# Patient Record
Sex: Male | Born: 2000 | Race: White | Hispanic: No | Marital: Single | Smoking: Former smoker
Health system: Southern US, Community
[De-identification: ages and names within clinical notes are randomized; demographics above are authoritative.]

## PROBLEM LIST (undated history)

## (undated) DIAGNOSIS — F419 Anxiety disorder, unspecified: Secondary | ICD-10-CM

## (undated) DIAGNOSIS — F909 Attention-deficit hyperactivity disorder, unspecified type: Secondary | ICD-10-CM

## (undated) DIAGNOSIS — IMO0002 Reserved for concepts with insufficient information to code with codable children: Secondary | ICD-10-CM

## (undated) DIAGNOSIS — M419 Scoliosis, unspecified: Secondary | ICD-10-CM

## (undated) HISTORY — DX: Anxiety disorder, unspecified: F41.9

## (undated) HISTORY — DX: Scoliosis, unspecified: M41.9

## (undated) HISTORY — DX: Reserved for concepts with insufficient information to code with codable children: IMO0002

## (undated) HISTORY — DX: Attention-deficit hyperactivity disorder, unspecified type: F90.9

---
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
## 2019-03-06 ENCOUNTER — Other Ambulatory Visit (INDEPENDENT_AMBULATORY_CARE_PROVIDER_SITE_OTHER): Payer: Self-pay | Admitting: Pediatric Endocrinology

## 2019-03-20 ENCOUNTER — Other Ambulatory Visit (INDEPENDENT_AMBULATORY_CARE_PROVIDER_SITE_OTHER): Payer: Self-pay | Admitting: Pediatric Endocrinology

## 2019-03-23 ENCOUNTER — Encounter (INDEPENDENT_AMBULATORY_CARE_PROVIDER_SITE_OTHER): Payer: Self-pay | Admitting: Pediatric Endocrinology

## 2019-03-23 ENCOUNTER — Other Ambulatory Visit: Payer: Self-pay

## 2019-03-23 ENCOUNTER — Ambulatory Visit (INDEPENDENT_AMBULATORY_CARE_PROVIDER_SITE_OTHER): Payer: Medicaid Other | Admitting: Pediatric Endocrinology

## 2019-03-23 DIAGNOSIS — F649 Gender identity disorder, unspecified: Secondary | ICD-10-CM | POA: Diagnosis not present

## 2019-03-23 DIAGNOSIS — F640 Transsexualism: Secondary | ICD-10-CM

## 2019-03-23 DIAGNOSIS — Z789 Other specified health status: Secondary | ICD-10-CM

## 2019-03-23 NOTE — Progress Notes (Signed)
This is a Pediatric Specialist E-Visit follow up consult provided via Telephone. Billy Oliver  consented to an E-Visit consult today.  Location of patient: Bo[place] Deans is at home (location) Location of provider: Koren Shiver is at office (location) Patient was referred by Richrd Sox, MD   The following participants were involved in this E-Visit: Dr. Vanessa Bushyhead and Billy Oliver (list of participants and their roles)  Chief Complain/ Reason for E-Visit today: Transgender Total time on call: 21 minutes Follow up: 3 months    Subjective:  Subjective  Patient Name: Billy Oliver Date of Birth: [birth year]  MRN: [id number]  Billy Oliver  presents Via Telephone today for initial evaluation and management of her gender dysphoria  HISTORY OF PRESENT ILLNESS:   Billy Oliver is a 18 y.o. transfemale   Billy Oliver was accompanied by herself   1. Billy Oliver was seen by her PCP in March 2018. At that visit they revisited previous concerns about gender identity. She had previously been referred to adolescent medicine in October 2017. Mom was unable to bring her to that appointment and the referral was dropped. She was seen in endocrine clinic in April 2018 and then was lost to follow up.   2. Billy Oliver was last seen in pediatric endocrine clinic on 12/21/18. In the interim she has been generally healthy.   At her last visit we started her on Bicalutamide. She hasn't noticed a lot of changes with this. She has noticed that she is having fewer erections.   She has stopped smoking.   She has not found a job yet. She is hoping to get her license gender marker changed this summer. She has a car now.   She has continued on Estrace, progesterone, Spironolactone and bicalutamide.  She is swallowing the prometrium.   She feels that her breasts are about the same size as they were at her last visit. They are no longer painful but are sensitive. She is on the fence about breast augmentation.   She would like  to have bottom surgery in the future if she can afford it.   She feels that her hair removal is somewhat less often with the spironolactone and bicalutamide.   She is not currently taking Vit D or Calcium.   She is still not seeing a therapist. She has thought about it- she is not sure that she could afford it.   3. Pertinent Review of Systems:  Constitutional: The patient feels "pretty good". The patient seems healthy and active. Eyes: Vision seems to be good. There are no recognized eye problems. Wears glasses.  Neck: The patient has no complaints of anterior neck swelling, soreness, tenderness, pressure, discomfort, or difficulty swallowing.   Heart: Heart rate increases with exercise or other physical activity. The patient has no complaints of palpitations, irregular heart beats, chest pain, or chest pressure.   Lungs: No asthma, or SOB Gastrointestinal: Bowel movents seem normal. The patient has no complaints of excessive hunger, acid reflux, upset stomach, stomach aches or pains, diarrhea, or constipation.  Legs: Muscle mass and strength seem normal. There are no complaints of numbness, tingling, burning, or pain. No edema is noted.  Feet: There are no obvious foot problems. There are no complaints of numbness, tingling, burning, or pain. No edema is noted. Neurologic: There are no recognized problems with muscle movement and strength, sensation, or coordination. GYN/GU:  Per HPI Skin: per HPI no acne.   PAST MEDICAL, FAMILY, AND SOCIAL HISTORY  Past Medical History:  Diagnosis Date  .  ADHD (attention deficit hyperactivity disorder)   . Anxiety   . Behavioral problems 06/21/2013  . Scoliosis     Family History  Problem Relation Age of Onset  . Diabetes Mother   . Hypertension Mother   . Neuropathy Mother   . Hyperlipidemia Mother   . Healthy Sister   . Heart disease Maternal Grandmother   . Cancer Maternal Grandfather   . Stroke Maternal Grandfather   . Diabetes Paternal  Grandmother   . Stroke Paternal Grandmother   . Aneurysm Paternal Grandfather   . Cancer Paternal Grandfather   . Drug abuse Father      Current Outpatient Medications:  .  bicalutamide (CASODEX) 50 MG tablet, TAKE 1 TABLET BY MOUTH EVERY DAY, Disp: 30 tablet, Rfl: 2 .  estradiol (ESTRACE) 2 MG tablet, TAKE 1 TABLET BY MOUTH EVERY DAY, Disp: 90 tablet, Rfl: 1 .  progesterone (PROMETRIUM) 100 MG capsule, TAKE 1 CAPSULE BY MOUTH EVERY DAY, Disp: 90 capsule, Rfl: 1 .  spironolactone (ALDACTONE) 50 MG tablet, Take 1 tablet (50 mg total) by mouth daily., Disp: 30 tablet, Rfl: 11  Allergies as of 03/23/2019  . (No Known Allergies)     reports that she has quit smoking. She has never used smokeless tobacco. She reports current drug use. Drug: Marijuana. She reports that she does not drink alcohol. Pediatric History  Patient Parents  . Ware,Susan (Mother)   Other Topics Concern  . Not on file  Social History Narrative   [employer] in 9th grade   GED 1. School and Family: Lives with mom  2. Activities: not active  3. Primary Care Provider: Richrd Sox, MD  ROS: There are no other significant problems involving Kincaid's other body systems.    Objective:  Objective  Vital Signs: Telephone visit  There were no vitals taken for this visit.  Blood pressure percentiles are not available for patients who are 18 years or older.  Ht Readings from Last 3 Encounters:  12/21/18 5' 2.6" (1.59 m) (<1 %, Z= -2.42)*  09/21/18 5' 2.68" (1.592 m) (<1 %, Z= -2.37)*  06/16/18 5' 3.31" (1.608 m) (2 %, Z= -2.12)*   * Growth percentiles are based on CDC (Boys, 2-20 Years) data.   Wt Readings from Last 3 Encounters:  12/21/18 140 lb (63.5 kg) (31 %, Z= -0.50)*  09/21/18 135 lb 6.4 oz (61.4 kg) (25 %, Z= -0.68)*  06/16/18 141 lb 3.2 oz (64 kg) (37 %, Z= -0.34)*   * Growth percentiles are based on CDC (Boys, 2-20 Years) data.   HC Readings from Last 3 Encounters:  No data found  for [facility]   There is no height or weight on file to calculate BSA. No height on file for this encounter. No weight on file for this encounter.   PHYSICAL EXAM:  Telephone visit   LAB DATA:   pending No results found for this or any previous visit (from the past 672 hour(s)).    Assessment and Plan:  Assessment  ASSESSMENT: Billy Oliver is a 18 y.o. caucasian transfemale followed for gender affirming hormone therapy.        Transgender - On Estrace, Spironolactone, Prometrium, and Bicalutamide - She continues to be pleased with feminization - She is looking into surgery- concerned about cost - She has not yet gotten her license gender marker changed- is planning to do this in the next month  - She is not currently seeing a therapist- discussed again importance of staying connected  with a provider.   PLAN:   1. Diagnostic:  CMP, lipids, vit d at next visit.  2. Therapeutic: Spironolactone 50 mg daily. Estrace 2 mg daily. Prometrium 100 mg daily. Bicalutamide 50 mg daily. Reviewed need for Vit D [phone] IU/day and Calcium [phone] mg/day 3. Patient education: Discussion of the above.  4. Follow-up: Return in about 3 months (around 06/23/2019).      Dessa PhiJennifer Shandie Bertz, MD  Telephone 21-30 min     Patient referred by Richrd SoxJohnson, Quan T, MD for gender dysphoria  Copy of this note sent to Richrd SoxJohnson, Quan T, MD

## 2019-03-23 NOTE — Patient Instructions (Addendum)
[facility] [phone]  Hazeline Junker [phone] [facility])  Lessons Learned Therapy Allyson Sabal [phone] [facility])   M. Mathis Dad Licensed Professional Counselor [phone] [facility] ** [phone]  Neysa Bonito, MS, [facility]  [address],  [place], [place] [address] [phone]  For bottom surgery: Dr Nancie Neas at [facility] [phone]  Vit D [phone] IU/day Calcium [phone] mg/day
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
## 2019-05-29 ENCOUNTER — Other Ambulatory Visit (INDEPENDENT_AMBULATORY_CARE_PROVIDER_SITE_OTHER): Payer: Self-pay | Admitting: Pediatric Endocrinology

## 2019-06-11 ENCOUNTER — Other Ambulatory Visit (INDEPENDENT_AMBULATORY_CARE_PROVIDER_SITE_OTHER): Payer: Self-pay | Admitting: Pediatric Endocrinology

## 2019-06-20 ENCOUNTER — Other Ambulatory Visit: Payer: Self-pay

## 2019-06-20 ENCOUNTER — Ambulatory Visit (INDEPENDENT_AMBULATORY_CARE_PROVIDER_SITE_OTHER): Payer: Medicaid Other | Admitting: Pediatric Endocrinology

## 2019-06-20 ENCOUNTER — Encounter (INDEPENDENT_AMBULATORY_CARE_PROVIDER_SITE_OTHER): Payer: Self-pay | Admitting: Pediatric Endocrinology

## 2019-06-20 VITALS — BP 98/60 | HR 82 | Wt 144.2 lb

## 2019-06-20 DIAGNOSIS — Z23 Encounter for immunization: Secondary | ICD-10-CM | POA: Diagnosis not present

## 2019-06-20 DIAGNOSIS — F640 Transsexualism: Secondary | ICD-10-CM | POA: Diagnosis not present

## 2019-06-20 DIAGNOSIS — Z789 Other specified health status: Secondary | ICD-10-CM

## 2019-06-20 NOTE — Progress Notes (Signed)
Subjective:  Subjective  Patient Name: Billy Oliver Date of Birth: [birth year]  MRN: [id number]  Billy Oliver  presents to clinic today for initial evaluation and management of her gender dysphoria  HISTORY OF PRESENT ILLNESS:   Billy Oliver is a 18 y.o. transfemale   Billy Oliver was accompanied by herself   1. Billy Oliver was seen by her PCP in March 2018. At that visit they revisited previous concerns about gender identity. She had previously been referred to adolescent medicine in October 2017. Mom was unable to bring her to that appointment and the referral was dropped. She was seen in endocrine clinic in April 2018 and then was lost to follow up.   2. Billy Oliver was last seen in pediatric endocrine clinic on 03/23/19. In the interim she has been generally healthy.   She is feeling frustrated about not being able to get a job. She doesn't have her GED and can't afford to get her name change/ID change. She worries that companies are discriminating against her in hiring although they are not overt about it. 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   She feels that her transition has slowed down since her last visit. She hasn't smoked since her last visit and she thought that her hormonal changes would be faster- but she feels stalled. She is coming up on her 1 year anniversary of starting her transition and feels that she should be further along. She wants more feminine features in her face and larger breasts. She feels that she should have more fullness in her face. She does not want plastic surgery.   She has continued on Bicalutamide. She hasn't noticed a lot of changes with this. She has noticed that she is having fewer erections.   She has continued on Estrace, progesterone, Spironolactone and bicalutamide.  She is swallowing the prometrium.   She feels that her breasts are about the same size as they were at her last visit. They are no longer painful or sensitive. She is on the fence about  breast augmentation.   She would like to have bottom surgery in the future if she can afford it.   She feels that her hair removal is somewhat less often with the spironolactone and bicalutamide. She is shaving about every other day.   She is not currently taking Vit D or Calcium.   She is still not seeing a therapist. She has thought about it- she is not sure that she could afford it.   3. Pertinent Review of Systems:  Constitutional: The patient feels "ok". The patient seems healthy and active. Eyes: Vision seems to be good. There are no recognized eye problems. Wears glasses.  Neck: The patient has no complaints of anterior neck swelling, soreness, tenderness, pressure, discomfort, or difficulty swallowing.   Heart: Heart rate increases with exercise or other physical activity. The patient has no complaints of palpitations, irregular heart beats, chest pain, or chest pressure.   Lungs: No asthma, or SOB Gastrointestinal: Bowel movents seem normal. The patient has no complaints of excessive hunger, acid reflux, upset stomach, stomach aches or pains, diarrhea, or constipation.  Legs: Muscle mass and strength seem normal. There are no complaints of numbness, tingling, burning, or pain. No edema is noted.  Feet: There are no obvious foot problems. There are no complaints of numbness, tingling, burning, or pain. No edema is noted. Neurologic: There are no recognized problems with muscle movement and strength, sensation, or coordination. GYN/GU:  Per HPI Skin: per  HPI no acne.   PAST MEDICAL, FAMILY, AND SOCIAL HISTORY  Past Medical History:  Diagnosis Date  . ADHD (attention deficit hyperactivity disorder)   . Anxiety   . Behavioral problems 06/21/2013  . Scoliosis     Family History  Problem Relation Age of Onset  . Diabetes Mother   . Hypertension Mother   . Neuropathy Mother   . Hyperlipidemia Mother   . Healthy Sister   . Heart disease Maternal Grandmother   . Cancer Maternal  Grandfather   . Stroke Maternal Grandfather   . Diabetes Paternal Grandmother   . Stroke Paternal Grandmother   . Aneurysm Paternal Grandfather   . Cancer Paternal Grandfather   . Drug abuse Father      Current Outpatient Medications:  .  bicalutamide (CASODEX) 50 MG tablet, TAKE 1 TABLET BY MOUTH EVERY DAY, Disp: 30 tablet, Rfl: 2 .  estradiol (ESTRACE) 2 MG tablet, TAKE 1 TABLET BY MOUTH EVERY DAY, Disp: 90 tablet, Rfl: 1 .  progesterone (PROMETRIUM) 100 MG capsule, TAKE 1 CAPSULE BY MOUTH EVERY DAY, Disp: 90 capsule, Rfl: 1 .  spironolactone (ALDACTONE) 50 MG tablet, TAKE 1 TABLET BY MOUTH EVERY DAY, Disp: 30 tablet, Rfl: 5  Allergies as of 06/20/2019  . (No Known Allergies)     reports that she has quit smoking. She has never used smokeless tobacco. She reports current drug use. Drug: Marijuana. She reports that she does not drink alcohol. Pediatric History  Patient Parents  . Ware,Susan (Mother)   Other Topics Concern  . Not on file  Social History Narrative   [employer] in 9th grade   GED 1. School and Family: Lives with mom  2. Activities: not active  3. Primary Care Provider: Richrd Sox, MD  ROS: There are no other significant problems involving Haydan's other body systems.    Objective:  Objective  Vital Signs:  BP 98/60   Pulse 82   Wt 144 lb 3.2 oz (65.4 kg)   BMI 25.87 kg/m   Blood pressure percentiles are not available for patients who are 18 years or older.  Ht Readings from Last 3 Encounters:  12/21/18 5' 2.6" (1.59 m) (26 %, Z= -0.65)*  09/21/18 5' 2.68" (1.592 m) (27 %, Z= -0.62)*  06/16/18 5' 3.31" (1.608 m) (36 %, Z= -0.36)*   * Growth percentiles are based on CDC (Girls, 2-20 Years) data.   Wt Readings from Last 3 Encounters:  06/20/19 144 lb 3.2 oz (65.4 kg) (76 %, Z= 0.71)*  12/21/18 140 lb (63.5 kg) (73 %, Z= 0.61)*  09/21/18 135 lb 6.4 oz (61.4 kg) (68 %, Z= 0.47)*   * Growth percentiles are based on CDC (Girls,  2-20 Years) data.   HC Readings from Last 3 Encounters:  No data found for [facility]   Body surface area is 1.7 meters squared. No height on file for this encounter. 76 %ile (Z= 0.71) based on CDC (Girls, 2-20 Years) weight-for-age data using vitals from 06/20/2019.   PHYSICAL EXAM:   Constitutional: The patient appears healthy and well nourished. The patient's height and weight are delayed for age.  She is short for MPH  She has gained 4 pounds.  Head: The head is normocephalic. Face: The face appears normal. There are no obvious dysmorphic features.  male pattern facial hair that is coarse.   Eyes: The eyes appear to be normally formed and spaced. Gaze is conjugate. There is no obvious arcus or proptosis. Moisture appears  normal. Ears: The ears are normally placed and appear externally normal. Mouth: The oropharynx and tongue appear normal. Dentition appears to be normal for age. Oral moisture is normal. Neck: The neck appears to be visibly normal.  The thyroid gland is 13 grams in size. The consistency of the thyroid gland is normal. The thyroid gland is not tender to palpation. Lungs: The lungs are clear to auscultation. Air movement is good. Heart: Heart rate and rhythm are regular. Heart sounds S1 and S2 are normal. I did not appreciate any pathologic cardiac murmurs. Abdomen: The abdomen appears to be normal in size for the patient's age. Bowel sounds are normal. There is no obvious hepatomegaly, splenomegaly, or other mass effect.  Arms: Muscle size and bulk are normal for age. Hands: There is no obvious tremor. Phalangeal and metacarpophalangeal joints are normal. Palmar muscles are normal for age. Palmar skin is normal. Palmar moisture is also normal. Legs: Muscles appear normal for age. No edema is present. Feet: Feet are normally formed. Dorsalis pedal pulses are normal. Neurologic: Strength is normal for age in both the upper and lower extremities. Muscle tone is normal. Sensation to  touch is normal in both the legs and feet.   GYN/GU: Puberty: Tanner stage pubic hair: V Tanner stage breast/genital V.   LAB DATA:   pending Results for orders placed or performed in visit on 06/20/19 (from the past 672 hour(s))  Follicle stimulating hormone   Collection Time: 06/20/19 12:00 AM  Result Value Ref Range   FSH 1.9 1.6 - 8.0 mIU/mL  Luteinizing hormone   Collection Time: 06/20/19 12:00 AM  Result Value Ref Range   LH 4.3 1.5 - 9.3 mIU/mL      Assessment and Plan:  Assessment  ASSESSMENT: Shanda BumpsJessica is a 18 y.o. caucasian transfemale followed for gender affirming hormone therapy.       Transgender - On Estrace, Spironolactone, Prometrium, and Bicalutamide - She is frustrated by rate of feminization - She is looking into surgery- concerned about cost - She has not yet gotten her license gender marker changed- is having issues with finances and costs associated with name change and gender marker change - She is not currently seeing a therapist- discussed again importance of staying connected with a provider.   PLAN:   1. Diagnostic:  CMP, lipids, vit d today 2. Therapeutic: Spironolactone 50 mg daily. Estrace 2 mg daily. Prometrium 100 mg daily. Bicalutamide 50 mg daily. Reviewed need for Vit D [phone] IU/day and Calcium [phone] mg/day. Will plan to increase Estrace after labs today 3. Patient education: Discussion of the above.  4. Follow-up: Return in about 3 months (around 09/19/2019).      Dessa PhiJennifer Valerie Fredin, MD  Level of Service: This visit lasted in excess of 25 minutes. More than 50% of the visit was devoted to counseling.    Patient referred by Richrd SoxJohnson, Quan T, MD for gender dysphoria  Copy of this note sent to Richrd SoxJohnson, Quan T, MD

## 2019-06-20 NOTE — Patient Instructions (Addendum)
Labs today  Will expect to increase your Estrogen dose  Consider taking Progestin per rectum.   Flu shot today! Remember to move that arm! It will take 2 weeks for full immune effect. This injection may not prevent flu but should reduce severity of disease.

## 2019-06-24 LAB — FOLLICLE STIMULATING HORMONE: FSH: 1.9 m[IU]/mL (ref 1.6–8.0)

## 2019-06-24 LAB — LUTEINIZING HORMONE: LH: 4.3 m[IU]/mL (ref 1.5–9.3)

## 2019-06-24 LAB — ESTRADIOL, ULTRA SENS: Estradiol, Ultra Sensitive: 79 pg/mL — ABNORMAL HIGH (ref <=29)

## 2019-06-27 ENCOUNTER — Other Ambulatory Visit (INDEPENDENT_AMBULATORY_CARE_PROVIDER_SITE_OTHER): Payer: Self-pay | Admitting: Pediatric Endocrinology

## 2019-06-27 MED ORDER — ESTRADIOL 2 MG PO TABS: 2.0000 mg | ORAL_TABLET | Freq: Two times a day (BID) | ORAL | 1 refills | Status: DC

## 2019-06-29 ENCOUNTER — Telehealth (INDEPENDENT_AMBULATORY_CARE_PROVIDER_SITE_OTHER): Payer: Self-pay | Admitting: *Deleted

## 2019-06-29 NOTE — Telephone Encounter (Signed)
LVM advised that per Dr. Baldo Ash:      Increase estradiol to 4 mg per day (2mg  twice a day). New Rx to pharmacy.    Please call with any questions.

## 2019-07-05 ENCOUNTER — Telehealth (INDEPENDENT_AMBULATORY_CARE_PROVIDER_SITE_OTHER): Payer: Self-pay | Admitting: Pediatric Endocrinology

## 2019-07-05 NOTE — Telephone Encounter (Signed)
  Who's calling (name and relationship to patient) : Billy Oliver, Patient  Best contact number: [phone]  Provider they see: Dr. Baldo Ash  Reason for call: Patient is calling to clarify with Dr. Baldo Ash about medication called estradiol. Went to pharmacy yesterday and they said they didn't have anything ready. but recalls that Dr. Baldo Ash said she would up the milligrams for the medication. Wondering if Dr. Baldo Ash said she would up the milligrams or if she said to just take 2 a day instead of 1 a day, wondering if she will/has sent over the prescription and if so which one is correct. Please advise.     PRESCRIPTION REFILL ONLY  Name of prescription:  Pharmacy:

## 2019-07-06 NOTE — Telephone Encounter (Signed)
Spoke with Billy Oliver. Let her know that she is supposed to be taking 2 mg a day. She states that the pharmacy told her that her medication hadnt been sent. I let her know that I would call the pharmacy and check on it for her.   Called pharmacy spoke with [place]. He states that the RX did come through that Billy Oliver just picked up her last RX and the new one wasn't ready to fill yet.   Called Billy Oliver back to let her know this. She verbally understands.

## 2019-08-31 ENCOUNTER — Other Ambulatory Visit (INDEPENDENT_AMBULATORY_CARE_PROVIDER_SITE_OTHER): Payer: Self-pay | Admitting: Pediatric Endocrinology

## 2019-09-08 ENCOUNTER — Other Ambulatory Visit (INDEPENDENT_AMBULATORY_CARE_PROVIDER_SITE_OTHER): Payer: Self-pay | Admitting: Pediatric Endocrinology

## 2019-09-25 ENCOUNTER — Other Ambulatory Visit: Payer: Self-pay

## 2019-09-25 ENCOUNTER — Ambulatory Visit (INDEPENDENT_AMBULATORY_CARE_PROVIDER_SITE_OTHER): Payer: Medicaid Other | Admitting: Pediatric Endocrinology

## 2019-09-25 ENCOUNTER — Encounter (INDEPENDENT_AMBULATORY_CARE_PROVIDER_SITE_OTHER): Payer: Self-pay | Admitting: Pediatric Endocrinology

## 2019-09-25 VITALS — BP 122/68 | HR 80 | Ht 62.8 in | Wt 150.8 lb

## 2019-09-25 DIAGNOSIS — F640 Transsexualism: Secondary | ICD-10-CM

## 2019-09-25 DIAGNOSIS — Z789 Other specified health status: Secondary | ICD-10-CM

## 2019-09-25 NOTE — Progress Notes (Signed)
Subjective:  Subjective  Patient Name: Billy Oliver Date of Birth: 02/28/2001  MRN: [id number]  Billy Oliver  presents to clinic today for initial evaluation and management of her gender dysphoria  HISTORY OF PRESENT ILLNESS:   Billy Oliver is a 18 y.o. transfemale   Billy Oliver was accompanied by herself   1. Billy Oliver was seen by her PCP in March 2018. At that visit they revisited previous concerns about gender identity. She had previously been referred to adolescent medicine in October 2017. Mom was unable to bring her to that appointment and the referral was dropped. She was seen in endocrine clinic in April 2018 and then was lost to follow up.   2. Billy Oliver was last seen in pediatric endocrine clinic on 06/20/19. In the interim she has been generally healthy.   She is now working at [employer] as a Nature conservation officer. She has enough hours to qualify for benefits. She is working in the grocery section.   She is working on working on getting her GED.   She is thinking about having breast augmentation.   She has noticed some increase in breast tissue with Bicalutamide. She is a 36 A now.   She has continued on Estrace 4 mg, progesterone and spironolactone.   She feels that her hair removal is somewhat less often with the spironolactone and bicalutamide. She is having facial hair waxed.   She is not currently taking Vit D or Calcium.   She is still not seeing a therapist. She has thought about it-  She is not sure that she needs someone.    3. Pertinent Review of Systems:  Constitutional: The patient feels "great". The patient seems healthy and active. Eyes: Vision seems to be good. There are no recognized eye problems. Wears glasses.  Neck: The patient has no complaints of anterior neck swelling, soreness, tenderness, pressure, discomfort, or difficulty swallowing.   Heart: Heart rate increases with exercise or other physical activity. The patient has no complaints of palpitations, irregular heart beats,  chest pain, or chest pressure.   Lungs: No asthma, or SOB Gastrointestinal: Bowel movents seem normal. The patient has no complaints of excessive hunger, acid reflux, upset stomach, stomach aches or pains, diarrhea, or constipation.  Legs: Muscle mass and strength seem normal. There are no complaints of numbness, tingling, burning, or pain. No edema is noted.  Feet: There are no obvious foot problems. There are no complaints of numbness, tingling, burning, or pain. No edema is noted. Neurologic: There are no recognized problems with muscle movement and strength, sensation, or coordination. GYN/GU:  Per HPI Skin: per HPI no acne.   PAST MEDICAL, FAMILY, AND SOCIAL HISTORY  Past Medical History:  Diagnosis Date  . ADHD (attention deficit hyperactivity disorder)   . Anxiety   . Behavioral problems 06/21/2013  . Scoliosis     Family History  Problem Relation Age of Onset  . Diabetes Mother   . Hypertension Mother   . Neuropathy Mother   . Hyperlipidemia Mother   . Healthy Sister   . Heart disease Maternal Grandmother   . Cancer Maternal Grandfather   . Stroke Maternal Grandfather   . Diabetes Paternal Grandmother   . Stroke Paternal Grandmother   . Aneurysm Paternal Grandfather   . Cancer Paternal Grandfather   . Drug abuse Father      Current Outpatient Medications:  .  bicalutamide (CASODEX) 50 MG tablet, TAKE 1 TABLET BY MOUTH EVERY DAY, Disp: 30 tablet, Rfl: 2 .  estradiol (ESTRACE)  2 MG tablet, Take 1 tablet (2 mg total) by mouth 2 (two) times daily., Disp: 180 tablet, Rfl: 1 .  progesterone (PROMETRIUM) 100 MG capsule, TAKE 1 CAPSULE BY MOUTH EVERY DAY, Disp: 90 capsule, Rfl: 1 .  spironolactone (ALDACTONE) 50 MG tablet, TAKE 1 TABLET BY MOUTH EVERY DAY, Disp: 30 tablet, Rfl: 5  Allergies as of 09/25/2019  . (No Known Allergies)     reports that she has quit smoking. She has never used smokeless tobacco. She reports current drug use. Drug: Marijuana. She reports that  she does not drink alcohol. Pediatric History  Patient Parents  . Ware,Susan (Mother)   Other Topics Concern  . Not on file  Social History Narrative   [employer] in 9th grade   GED 1. School and Family: Lives with mom  Working at [employer] 2. Activities: not active  3. Primary Care Provider: Richrd SoxJohnson, Quan T, MD  ROS: There are no other significant problems involving [place]'s other body systems.    Objective:  Objective  Vital Signs:   BP 122/68   Pulse 80   Ht 5' 2.8" (1.595 m)   Wt 150 lb 12.8 oz (68.4 kg)   BMI 26.89 kg/m   Blood pressure percentiles are not available for patients who are 18 years or older.  Ht Readings from Last 3 Encounters:  09/25/19 5' 2.8" (1.595 m) (28 %, Z= -0.59)*  12/21/18 5' 2.6" (1.59 m) (26 %, Z= -0.65)*  09/21/18 5' 2.68" (1.592 m) (27 %, Z= -0.62)*   * Growth percentiles are based on CDC (Girls, 2-20 Years) data.   Wt Readings from Last 3 Encounters:  09/25/19 150 lb 12.8 oz (68.4 kg) (82 %, Z= 0.90)*  06/20/19 144 lb 3.2 oz (65.4 kg) (76 %, Z= 0.71)*  12/21/18 140 lb (63.5 kg) (73 %, Z= 0.61)*   * Growth percentiles are based on CDC (Girls, 2-20 Years) data.   HC Readings from Last 3 Encounters:  No [facility] found for [facility]   Body surface area is 1.74 meters squared. 28 %ile (Z= -0.59) based on CDC (Girls, 2-20 Years) Stature-for-age data based on Stature recorded on 09/25/2019. 82 %ile (Z= 0.90) based on CDC (Girls, 2-20 Years) weight-for-age data using vitals from 09/25/2019.   PHYSICAL EXAM:   Constitutional: The patient appears healthy and well nourished. The patient's height and weight are delayed for age.  She is short for MPH  She has gained 6 pounds.  Head: The head is normocephalic. Face: The face appears normal. There are no obvious dysmorphic features.  male pattern facial hair that is coarse.   Eyes: The eyes appear to be normally formed and spaced. Gaze is conjugate. There is no obvious arcus or proptosis.  Moisture appears normal. Ears: The ears are normally placed and appear externally normal. Mouth: The oropharynx and tongue appear normal. Dentition appears to be normal for age. Oral moisture is normal. Neck: The neck appears to be visibly normal.  The thyroid gland is 13 grams in size. The consistency of the thyroid gland is normal. The thyroid gland is not tender to palpation. Lungs: The lungs are clear to auscultation. Air movement is good. Heart: Heart rate and rhythm are regular. Heart sounds S1 and S2 are normal. I did not appreciate any pathologic cardiac murmurs. Abdomen: The abdomen appears to be normal in size for the patient's age. Bowel sounds are normal. There is no obvious hepatomegaly, splenomegaly, or other mass effect.  Arms: Muscle size and bulk  are normal for age. Hands: There is no obvious tremor. Phalangeal and metacarpophalangeal joints are normal. Palmar muscles are normal for age. Palmar skin is normal. Palmar moisture is also normal. Legs: Muscles appear normal for age. No edema is present. Feet: Feet are normally formed. Dorsalis pedal pulses are normal. Neurologic: Strength is normal for age in both the upper and lower extremities. Muscle tone is normal. Sensation to touch is normal in both the legs and feet.   GYN/GU: Puberty: Tanner stage pubic hair: V Tanner stage breast/genital V.   LAB DATA:   pending No results found for this or any previous visit (from the past 672 hour(s)).    Assessment and Plan:  Assessment  ASSESSMENT: Janett Billow is a 18 y.o. caucasian transfemale followed for gender affirming hormone therapy.      Transgender - On Estrace, Spironolactone, Prometrium, and Bicalutamide - She is still frustrated by rate of feminization - She is looking into surgery- she is mostly focused on breast augmentation - She has not yet gotten her license gender marker changed- is having issues with finances and costs associated with name change and gender marker  change - She is not currently seeing a therapist- discussed again importance of staying connected with a provider.   PLAN:  1. Diagnostic:  Bmp, testosterone, estradiol today 2. Therapeutic: Spironolactone 50 mg daily. Estrace 4 mg daily. Prometrium 100 mg daily. Bicalutamide 50 mg daily. Reviewed need for Vit D [phone] IU/day and Calcium [phone] mg/day.  3. Patient education: Discussion of the above.  4. Follow-up: Return in about 6 months (around 03/25/2020).      Lelon Huh, MD  Level of Service: This visit lasted in excess of 25 minutes. More than 50% of the visit was devoted to counseling.     Patient referred by Kyra Leyland, MD for gender dysphoria  Copy of this note sent to Kyra Leyland, MD

## 2019-09-25 NOTE — Patient Instructions (Addendum)
Labs today  Vit D [phone] IU/day and Calcium [phone] mg/day   M. Esperanza Heir Licensed Professional Counselor [phone]  * Theodoro Parma [phone]  * Rosana Fret, [place], [address],  [place], [place] [address] [phone] - [phone]

## 2019-09-28 LAB — BASIC METABOLIC PANEL
BUN: 14 mg/dL (ref 7–20)
CO2: 29 mmol/L (ref 20–32)
Calcium: 9.8 mg/dL (ref 8.9–10.4)
Chloride: 101 mmol/L (ref 98–110)
Creat: 0.93 mg/dL (ref 0.60–1.26)
Glucose, Bld: 96 mg/dL (ref 65–139)
Potassium: 3.5 mmol/L — ABNORMAL LOW (ref 3.8–5.1)
Sodium: 139 mmol/L (ref 135–146)

## 2019-09-28 LAB — ESTRADIOL: Estradiol: 87 pg/mL — ABNORMAL HIGH (ref <=39)

## 2019-09-28 LAB — TESTOS,TOTAL,FREE AND SHBG (FEMALE)
Free Testosterone: 83.8 pg/mL (ref 35.0–155.0)
Sex Hormone Binding: 21 nmol/L (ref 10–50)
Testosterone, Total, LC-MS-MS: 379 ng/dL (ref 250–1100)

## 2019-10-08 ENCOUNTER — Encounter (INDEPENDENT_AMBULATORY_CARE_PROVIDER_SITE_OTHER): Payer: Self-pay

## 2019-10-09 ENCOUNTER — Encounter (INDEPENDENT_AMBULATORY_CARE_PROVIDER_SITE_OTHER): Payer: Self-pay

## 2019-10-24 ENCOUNTER — Encounter (INDEPENDENT_AMBULATORY_CARE_PROVIDER_SITE_OTHER): Payer: Self-pay

## 2019-11-02 ENCOUNTER — Ambulatory Visit: Payer: Medicaid Other | Admitting: General Surgery

## 2019-11-17 ENCOUNTER — Encounter (INDEPENDENT_AMBULATORY_CARE_PROVIDER_SITE_OTHER): Payer: Self-pay | Admitting: Pediatric Endocrinology

## 2019-11-27 ENCOUNTER — Other Ambulatory Visit (INDEPENDENT_AMBULATORY_CARE_PROVIDER_SITE_OTHER): Payer: Self-pay | Admitting: Pediatric Endocrinology

## 2019-11-28 ENCOUNTER — Ambulatory Visit: Payer: Medicaid Other | Admitting: General Surgery

## 2019-11-29 ENCOUNTER — Other Ambulatory Visit (INDEPENDENT_AMBULATORY_CARE_PROVIDER_SITE_OTHER): Payer: Self-pay | Admitting: Pediatric Endocrinology

## 2019-12-19 ENCOUNTER — Encounter (INDEPENDENT_AMBULATORY_CARE_PROVIDER_SITE_OTHER): Payer: Self-pay

## 2019-12-20 ENCOUNTER — Encounter (INDEPENDENT_AMBULATORY_CARE_PROVIDER_SITE_OTHER): Payer: Self-pay

## 2019-12-20 MED ORDER — ESTRADIOL 2 MG PO TABS: 2.0000 mg | ORAL_TABLET | Freq: Two times a day (BID) | ORAL | 1 refills | Status: DC

## 2019-12-24 ENCOUNTER — Encounter (INDEPENDENT_AMBULATORY_CARE_PROVIDER_SITE_OTHER): Payer: Self-pay

## 2019-12-25 ENCOUNTER — Encounter (INDEPENDENT_AMBULATORY_CARE_PROVIDER_SITE_OTHER): Payer: Self-pay

## 2019-12-26 ENCOUNTER — Encounter (INDEPENDENT_AMBULATORY_CARE_PROVIDER_SITE_OTHER): Payer: Self-pay

## 2019-12-26 NOTE — Telephone Encounter (Signed)
Yes- Gunnar Fusi reached out to me yesterday. She is going to see Dr. Leta Baptist  [phone]

## 2019-12-28 ENCOUNTER — Encounter (INDEPENDENT_AMBULATORY_CARE_PROVIDER_SITE_OTHER): Payer: Self-pay

## 2019-12-28 NOTE — Telephone Encounter (Signed)
It's for transgender top surgery (male).

## 2020-01-03 ENCOUNTER — Telehealth (INDEPENDENT_AMBULATORY_CARE_PROVIDER_SITE_OTHER): Payer: Self-pay | Admitting: Pediatric Endocrinology

## 2020-01-03 DIAGNOSIS — F640 Transsexualism: Secondary | ICD-10-CM

## 2020-01-03 DIAGNOSIS — Z789 Other specified health status: Secondary | ICD-10-CM

## 2020-01-03 NOTE — Telephone Encounter (Signed)
  Who's calling (name and relationship to patient) : [facility]  [facility] contact number: [phone] Provider they see: [facility] Reason for call: Billy Oliver's appt is tomorrow,  [facility] has not received a referral.  Please call ASAP.     PRESCRIPTION REFILL ONLY  Name of prescription:  Pharmacy:

## 2020-01-03 NOTE — Telephone Encounter (Signed)
Referral is sent.

## 2020-01-16 NOTE — Telephone Encounter (Signed)
Patient called to follow up on the last note. Billy Oliver is stating that [facility] has not received the referral for the augmentation yet and her appointment is tomorrow. Please follow up with patient & Cosmetic office

## 2020-01-16 NOTE — Telephone Encounter (Signed)
Spoke with [facility] office and confirmed they did not receive the referral. Fax number obtained, and referral faxed to them.

## 2020-01-23 ENCOUNTER — Encounter (INDEPENDENT_AMBULATORY_CARE_PROVIDER_SITE_OTHER): Payer: Self-pay | Admitting: Pediatric Endocrinology

## 2020-03-25 ENCOUNTER — Ambulatory Visit (INDEPENDENT_AMBULATORY_CARE_PROVIDER_SITE_OTHER): Payer: Medicaid Other | Admitting: Pediatric Endocrinology

## 2020-03-28 ENCOUNTER — Ambulatory Visit (INDEPENDENT_AMBULATORY_CARE_PROVIDER_SITE_OTHER): Payer: Medicaid Other | Admitting: Pediatric Endocrinology

## 2020-04-01 ENCOUNTER — Encounter (INDEPENDENT_AMBULATORY_CARE_PROVIDER_SITE_OTHER): Payer: Self-pay | Admitting: Pediatric Endocrinology

## 2020-04-01 ENCOUNTER — Ambulatory Visit (INDEPENDENT_AMBULATORY_CARE_PROVIDER_SITE_OTHER): Payer: Medicaid Other | Admitting: Pediatric Endocrinology

## 2020-04-01 ENCOUNTER — Other Ambulatory Visit: Payer: Self-pay

## 2020-04-01 VITALS — BP 118/76 | HR 88 | Wt 150.8 lb

## 2020-04-01 DIAGNOSIS — Z789 Other specified health status: Secondary | ICD-10-CM

## 2020-04-01 DIAGNOSIS — F640 Transsexualism: Secondary | ICD-10-CM

## 2020-04-01 NOTE — Progress Notes (Signed)
Subjective:  Subjective  Patient Name: Billy Oliver Date of Birth: 09/14/2001  MRN: [id number]  Billy Oliver  presents to clinic today for initial evaluation and management of her gender dysphoria  HISTORY OF PRESENT ILLNESS:   Billy Oliver is a 19 y.o. transfemale   Billy Oliver was accompanied by herself   1. Billy Oliver was seen by her PCP in March 2018. At that visit they revisited previous concerns about gender identity. She had previously been referred to adolescent medicine in October 2017. Mom was unable to bring her to that appointment and the referral was dropped. She was seen in endocrine clinic in April 2018 and then was lost to follow up.   2. Billy Oliver was last seen in pediatric endocrine clinic on 09/25/19. In the interim she has been generally healthy.   She is no longer working at [employer]. 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   She is currently unemployed.   She had a visit with Dr. Leta Baptist but didn't think that it went well. She is worried about long term expenses with breast implants.   She is back to sitting at home all the time.   She has stopped smoking. She is no longer hacking up phlem. She says that she stopped about a year ago.   She does not feel that her breasts have increased in size. She is still a 36 A. Her friend is the same size.   She has continued on Estrace 4 mg, progesterone and spironolactone, and bicalutamide.   She feels that her hair removal is somewhat less often with the spironolactone and bicalutamide. She is having facial hair waxed. She feels that it is only "stubble" on the tip of her chin.   She is not currently taking Vit D or Calcium.   She is still not seeing a therapist. She has thought about it-  She is not sure that she needs someone. She has had 1 visit with Junie Bame but has not returned. She is meant to follow up with her this month.    3. Pertinent Review of Systems:  Constitutional: The patient feels  "alright but hot". The patient seems healthy and active. Eyes: Vision seems to be good. There are no recognized eye problems. Wears glasses.  Neck: The patient has no complaints of anterior neck swelling, soreness, tenderness, pressure, discomfort, or difficulty swallowing.   Heart: Heart rate increases with exercise or other physical activity. The patient has no complaints of palpitations, irregular heart beats, chest pain, or chest pressure.   Lungs: No asthma, or SOB Gastrointestinal: Bowel movents seem normal. The patient has no complaints of excessive hunger, acid reflux, upset stomach, stomach aches or pains, diarrhea, or constipation.  Legs: Muscle mass and strength seem normal. There are no complaints of numbness, tingling, burning, or pain. No edema is noted.  Feet: There are no obvious foot problems. There are no complaints of numbness, tingling, burning, or pain. No edema is noted. Neurologic: There are no recognized problems with muscle movement and strength, sensation, or coordination. GYN/GU:  Per HPI Skin: per HPI no acne.   PAST MEDICAL, FAMILY, AND SOCIAL HISTORY  Past Medical History:  Diagnosis Date  . ADHD (attention deficit hyperactivity disorder)   . Anxiety   . Behavioral problems 06/21/2013  . Scoliosis     Family History  Problem Relation Age of Onset  . Diabetes Mother   . Hypertension Mother   . Neuropathy Mother   .  Hyperlipidemia Mother   . Healthy Sister   . Heart disease Maternal Grandmother   . Cancer Maternal Grandfather   . Stroke Maternal Grandfather   . Diabetes Paternal Grandmother   . Stroke Paternal Grandmother   . Aneurysm Paternal Grandfather   . Cancer Paternal Grandfather   . Drug abuse Father      Current Outpatient Medications:  .  bicalutamide (CASODEX) 50 MG tablet, TAKE 1 TABLET BY MOUTH EVERY DAY, Disp: 30 tablet, Rfl: 5 .  estradiol (ESTRACE) 2 MG tablet, Take 1 tablet (2 mg total) by mouth 2 (two) times daily., Disp: 180  tablet, Rfl: 1 .  progesterone (PROMETRIUM) 100 MG capsule, TAKE 1 CAPSULE BY MOUTH EVERY DAY, Disp: 90 capsule, Rfl: 1 .  spironolactone (ALDACTONE) 50 MG tablet, TAKE 1 TABLET BY MOUTH EVERY DAY, Disp: 30 tablet, Rfl: 5  Allergies as of 04/01/2020  . (No Known Allergies)     reports that she has quit smoking. She has never used smokeless tobacco. She reports current drug use. Drug: Marijuana. She reports that she does not drink alcohol. Pediatric History  Patient Parents  . Ware,Susan (Mother)   Other Topics Concern  . Not on file  Social History Narrative   [employer] in 9th grade   GED 1. School and Family: Lives with mom  Unemployed. She hasn't gotten involved with her GED.  2. Activities: not active  3. Primary Care Provider: Kyra Leyland, MD  ROS: There are no other significant problems involving [place]'s other body systems.    Objective:  Objective  Vital Signs:    BP 118/76   Pulse 88   Wt 150 lb 12.8 oz (68.4 kg)   BMI 26.89 kg/m   Blood pressure percentiles are not available for patients who are 18 years or older.  Ht Readings from Last 3 Encounters:  09/25/19 5' 2.8" (1.595 m) (28 %, Z= -0.59)*  12/21/18 5' 2.6" (1.59 m) (26 %, Z= -0.65)*  09/21/18 5' 2.68" (1.592 m) (27 %, Z= -0.62)*   * Growth percentiles are based on CDC (Girls, 2-20 Years) data.   Wt Readings from Last 3 Encounters:  04/01/20 150 lb 12.8 oz (68.4 kg) (81 %, Z= 0.86)*  09/25/19 150 lb 12.8 oz (68.4 kg) (82 %, Z= 0.90)*  06/20/19 144 lb 3.2 oz (65.4 kg) (76 %, Z= 0.71)*   * Growth percentiles are based on CDC (Girls, 2-20 Years) data.   HC Readings from Last 3 Encounters:  No data found for [facility]   Body surface area is 1.74 meters squared. No height on file for this encounter. 81 %ile (Z= 0.86) based on CDC (Girls, 2-20 Years) weight-for-age data using vitals from 04/01/2020.   PHYSICAL EXAM:   Constitutional: The patient appears healthy and well nourished. The  patient's height and weight are delayed for age.  She is short for MPH  Weight is stable  Head: The head is normocephalic. Face: The face appears normal. There are no obvious dysmorphic features.  male pattern facial hair that is coarse- mostly on chin Eyes: The eyes appear to be normally formed and spaced. Gaze is conjugate. There is no obvious arcus or proptosis. Moisture appears normal. Ears: The ears are normally placed and appear externally normal. Mouth: The oropharynx and tongue appear normal. Dentition appears to be normal for age. Oral moisture is normal. Neck: The neck appears to be visibly normal.  The thyroid gland is 13 grams in size. The consistency of the  thyroid gland is normal. The thyroid gland is not tender to palpation. Lungs: The lungs are clear to auscultation. Air movement is good. Heart: Heart rate and rhythm are regular. Heart sounds S1 and S2 are normal. I did not appreciate any pathologic cardiac murmurs. Abdomen: The abdomen appears to be normal in size for the patient's age. Bowel sounds are normal. There is no obvious hepatomegaly, splenomegaly, or other mass effect.  Arms: Muscle size and bulk are normal for age. Hands: There is no obvious tremor. Phalangeal and metacarpophalangeal joints are normal. Palmar muscles are normal for age. Palmar skin is normal. Palmar moisture is also normal. Legs: Muscles appear normal for age. No edema is present. Feet: Feet are normally formed. Dorsalis pedal pulses are normal. Neurologic: Strength is normal for age in both the upper and lower extremities. Muscle tone is normal. Sensation to touch is normal in both the legs and feet.   GYN/GU: Puberty: Tanner stage pubic hair: V Tanner stage breast/genital V.   LAB DATA:   pending No results found for this or any previous visit (from the past 672 hour(s)).    Assessment and Plan:  Assessment  ASSESSMENT: Billy Oliver is a 19 y.o. caucasian transfemale followed for gender affirming  hormone therapy.      Transgender - On Estrace, Spironolactone, Prometrium, and Bicalutamide - She did not have a positive experience with the breast surgeon and felt that the long term maintenance costs of surgical breast enhancement would be prohibitive.  - She has not yet gotten her license gender marker changed- is having issues with finances and costs associated with name change and gender marker change - She has established care with Junie Bame for therapy.   PLAN:  1. Diagnostic: CMP, Testosterone, LH, FSH, Lipids today 2. Therapeutic: Spironolactone 50 mg daily. Estrace 4 mg daily. Prometrium 100 mg daily. Bicalutamide 50 mg daily. Reviewed need for Vit D [phone] IU/day and Calcium [phone] mg/day.  3. Patient education: Discussion of the above. Also discussed GED.  4. Follow-up: Return in about 4 months (around 08/01/2020).      Dessa Phi, MD  Level of Service: >40 minutes spent today reviewing the medical chart, counseling the patient/family, and documenting today's encounter.    Patient referred by Richrd Sox, MD for gender dysphoria  Copy of this note sent to Richrd Sox, MD

## 2020-04-01 NOTE — Patient Instructions (Signed)
No change to medication.   Labs today.   Work on Music therapist.   Work on getting your gender marker changed on your license.   [email or web address]

## 2020-04-04 LAB — CBC WITH DIFFERENTIAL/PLATELET
Absolute Monocytes: 686 cells/uL (ref 200–950)
Basophils Absolute: 44 cells/uL (ref 0–200)
Basophils Relative: 0.5 %
Eosinophils Absolute: 167 cells/uL (ref 15–500)
Eosinophils Relative: 1.9 %
HCT: 45.4 % (ref 38.5–50.0)
Hemoglobin: 15.2 g/dL (ref 13.2–17.1)
Lymphs Abs: 2420 cells/uL (ref 850–3900)
MCH: 29.6 pg (ref 27.0–33.0)
MCHC: 33.5 g/dL (ref 32.0–36.0)
MCV: 88.5 fL (ref 80.0–100.0)
MPV: 10.1 fL (ref 7.5–12.5)
Monocytes Relative: 7.8 %
Neutro Abs: 5482 cells/uL (ref 1500–7800)
Neutrophils Relative %: 62.3 %
Platelets: 293 10*3/uL (ref 140–400)
RBC: 5.13 10*6/uL (ref 4.20–5.80)
RDW: 11.9 % (ref 11.0–15.0)
Total Lymphocyte: 27.5 %
WBC: 8.8 10*3/uL (ref 3.8–10.8)

## 2020-04-04 LAB — LUTEINIZING HORMONE: LH: 6.9 m[IU]/mL (ref 1.5–9.3)

## 2020-04-04 LAB — COMPREHENSIVE METABOLIC PANEL
AG Ratio: 1.7 (calc) (ref 1.0–2.5)
ALT: 28 U/L (ref 8–46)
AST: 22 U/L (ref 12–32)
Albumin: 4.9 g/dL (ref 3.6–5.1)
Alkaline phosphatase (APISO): 80 U/L (ref 46–169)
BUN: 15 mg/dL (ref 7–20)
CO2: 25 mmol/L (ref 20–32)
Calcium: 9.7 mg/dL (ref 8.9–10.4)
Chloride: 103 mmol/L (ref 98–110)
Creat: 0.98 mg/dL (ref 0.60–1.26)
Globulin: 2.9 g/dL (calc) (ref 2.1–3.5)
Glucose, Bld: 92 mg/dL (ref 65–99)
Potassium: 3.4 mmol/L — ABNORMAL LOW (ref 3.8–5.1)
Sodium: 139 mmol/L (ref 135–146)
Total Bilirubin: 0.7 mg/dL (ref 0.2–1.1)
Total Protein: 7.8 g/dL (ref 6.3–8.2)

## 2020-04-04 LAB — FOLLICLE STIMULATING HORMONE: FSH: 1.3 m[IU]/mL — ABNORMAL LOW (ref 1.6–8.0)

## 2020-04-04 LAB — LIPID PANEL
Cholesterol: 221 mg/dL — ABNORMAL HIGH (ref <170)
HDL: 43 mg/dL — ABNORMAL LOW (ref >45)
LDL Cholesterol (Calc): 163 mg/dL (calc) — ABNORMAL HIGH (ref <110)
Non-HDL Cholesterol (Calc): 178 mg/dL (calc) — ABNORMAL HIGH (ref <120)
Total CHOL/HDL Ratio: 5.1 (calc) — ABNORMAL HIGH (ref <5.0)
Triglycerides: 62 mg/dL (ref <90)

## 2020-04-04 LAB — TESTOS,TOTAL,FREE AND SHBG (FEMALE)
Free Testosterone: 48.7 pg/mL (ref 35.0–155.0)
Sex Hormone Binding: 28 nmol/L (ref 10–50)
Testosterone, Total, LC-MS-MS: 272 ng/dL (ref 250–1100)

## 2020-05-30 ENCOUNTER — Other Ambulatory Visit (INDEPENDENT_AMBULATORY_CARE_PROVIDER_SITE_OTHER): Payer: Self-pay | Admitting: Pediatric Endocrinology

## 2020-08-01 ENCOUNTER — Ambulatory Visit (INDEPENDENT_AMBULATORY_CARE_PROVIDER_SITE_OTHER): Payer: Medicaid Other | Admitting: Pediatric Endocrinology

## 2020-08-02 ENCOUNTER — Other Ambulatory Visit (INDEPENDENT_AMBULATORY_CARE_PROVIDER_SITE_OTHER): Payer: Self-pay | Admitting: Pediatric Endocrinology

## 2020-08-06 ENCOUNTER — Ambulatory Visit (INDEPENDENT_AMBULATORY_CARE_PROVIDER_SITE_OTHER): Payer: Medicaid Other | Admitting: Pediatric Endocrinology

## 2020-09-26 ENCOUNTER — Encounter (INDEPENDENT_AMBULATORY_CARE_PROVIDER_SITE_OTHER): Payer: Self-pay | Admitting: Pediatric Endocrinology

## 2020-09-26 ENCOUNTER — Ambulatory Visit (INDEPENDENT_AMBULATORY_CARE_PROVIDER_SITE_OTHER): Payer: Medicaid Other | Admitting: Pediatric Endocrinology

## 2020-09-26 ENCOUNTER — Other Ambulatory Visit: Payer: Self-pay

## 2020-09-26 ENCOUNTER — Other Ambulatory Visit (INDEPENDENT_AMBULATORY_CARE_PROVIDER_SITE_OTHER): Payer: Self-pay | Admitting: Pediatric Endocrinology

## 2020-09-26 VITALS — BP 124/67 | HR 73 | Wt 158.7 lb

## 2020-09-26 DIAGNOSIS — Z23 Encounter for immunization: Secondary | ICD-10-CM | POA: Diagnosis not present

## 2020-09-26 DIAGNOSIS — Z789 Other specified health status: Secondary | ICD-10-CM | POA: Diagnosis not present

## 2020-09-26 MED ORDER — MEDROXYPROGESTERONE ACETATE 150 MG/ML IM SUSP
150.0000 mg | INTRAMUSCULAR | 3 refills | Status: DC
Start: 2020-09-26 — End: 2020-09-26

## 2020-09-26 MED ORDER — ESTRADIOL 2 MG PO TABS: 2.0000 mg | ORAL_TABLET | Freq: Three times a day (TID) | ORAL | 1 refills | Status: DC

## 2020-09-26 MED ORDER — MEDROXYPROGESTERONE ACETATE 150 MG/ML IM SUSP
150.0000 mg | Freq: Once | INTRAMUSCULAR | Status: AC
  Administered 2020-09-26: 150 mg via INTRAMUSCULAR

## 2020-09-26 NOTE — Patient Instructions (Signed)
Billy Oliver [phone]  Increase Estrace to 3 times a day.   No nicotine!  Calcium 1 mg daily Vit D 2000 IU daily

## 2020-09-26 NOTE — Progress Notes (Signed)
Subjective:  Subjective  Patient Name: Billy Oliver Date of Birth: 2001/09/12  MRN: [id number]  Billy Oliver  presents to clinic today for initial evaluation and management of her gender dysphoria  HISTORY OF PRESENT ILLNESS:   Billy Oliver is a 19 y.o. transfemale   Billy Oliver was accompanied by herself   1. Billy Oliver was seen by her PCP in March 2018. At that visit they revisited previous concerns about gender identity. She had previously been referred to adolescent medicine in October 2017. Mom was unable to bring her to that appointment and the referral was dropped. She was seen in endocrine clinic in April 2018 and then was lost to follow up.   2. Billy Oliver was last seen in pediatric endocrine clinic on 04/01/20. In the interim she has been generally healthy.   She is working at [employer] again. She is stressing that she has gained too much weight- and mostly around her middle.   She is not smoking. She has not been smoking since 2020.   She feels that her breasts are a little larger - she is now a 36 B.   She has continued on Estrace 4 mg, progesterone and spironolactone, and bicalutamide.   She had been waxing her face- she stopped because she was breaking out too much. She is now back to shaving daily.   She has been taking Vit C.  She is not currently taking Vit D or Calcium.   She did not have follow up with Junie Bame.  She says that she got a new phone and lost the number.    3. Pertinent Review of Systems:  Constitutional: The patient feels "good". The patient seems healthy and active. Eyes: Vision seems to be good. There are no recognized eye problems. Wears glasses.  Neck: The patient has no complaints of anterior neck swelling, soreness, tenderness, pressure, discomfort, or difficulty swallowing.   Heart: Heart rate increases with exercise or other physical activity. The patient has no complaints of palpitations, irregular heart beats, chest pain, or chest pressure.   Lungs: No  asthma, or SOB Gastrointestinal: Bowel movents seem normal. The patient has no complaints of excessive hunger, acid reflux, upset stomach, stomach aches or pains, diarrhea, or constipation.  Legs: Muscle mass and strength seem normal. There are no complaints of numbness, tingling, burning, or pain. No edema is noted.  Feet: There are no obvious foot problems. There are no complaints of numbness, tingling, burning, or pain. No edema is noted. Neurologic: There are no recognized problems with muscle movement and strength, sensation, or coordination. GYN/GU:  Per HPI Skin: per HPI no acne.   PAST MEDICAL, FAMILY, AND SOCIAL HISTORY  Past Medical History:  Diagnosis Date  . ADHD (attention deficit hyperactivity disorder)   . Anxiety   . Behavioral problems 06/21/2013  . Scoliosis     Family History  Problem Relation Age of Onset  . Diabetes Mother   . Hypertension Mother   . Neuropathy Mother   . Hyperlipidemia Mother   . Healthy Sister   . Heart disease Maternal Grandmother   . Cancer Maternal Grandfather   . Stroke Maternal Grandfather   . Diabetes Paternal Grandmother   . Stroke Paternal Grandmother   . Aneurysm Paternal Grandfather   . Cancer Paternal Grandfather   . Drug abuse Father      Current Outpatient Medications:  .  bicalutamide (CASODEX) 50 MG tablet, TAKE 1 TABLET BY MOUTH EVERY DAY, Disp: 30 tablet, Rfl: 5 .  progesterone (  PROMETRIUM) 100 MG capsule, TAKE 1 CAPSULE BY MOUTH EVERY DAY, Disp: 90 capsule, Rfl: 1 .  spironolactone (ALDACTONE) 50 MG tablet, TAKE 1 TABLET BY MOUTH EVERY DAY, Disp: 30 tablet, Rfl: 5 .  estradiol (ESTRACE) 2 MG tablet, Take 1 tablet (2 mg total) by mouth 3 (three) times daily., Disp: 270 tablet, Rfl: 1 .  medroxyPROGESTERone (DEPO-PROVERA) 150 MG/ML injection, Inject 1 mL (150 mg total) into the muscle every 3 (three) months., Disp: 1 mL, Rfl: 3  Allergies as of 09/26/2020  . (No Known Allergies)     reports that she has quit smoking.  She has never used smokeless tobacco. She reports current drug use. Drug: Marijuana. She reports that she does not drink alcohol. Pediatric History  Patient Parents  . Ware,Susan (Mother)   Other Topics Concern  . Not on file  Social History Narrative   [employer] in 9th grade    1. School and Family: Lives with mom  Working at [employer].  She hasn't gotten her GED. She applied for night classes- went for one course and then thought they were mean and dropped out.  2. Activities: not active  3. Primary Care Provider: Richrd Sox, MD  ROS: There are no other significant problems involving Loris's other body systems.    Objective:  Objective  Vital Signs:    BP 124/67   Pulse 73   Wt 158 lb 11.7 oz (72 kg)   BMI 28.30 kg/m   Growth percentile SmartLinks can only be used for patients less than 22 years old.  Ht Readings from Last 3 Encounters:  09/25/19 5' 2.8" (1.595 m) (28 %, Z= -0.59)*  12/21/18 5' 2.6" (1.59 m) (26 %, Z= -0.65)*  09/21/18 5' 2.68" (1.592 m) (27 %, Z= -0.62)*   * Growth percentiles are based on CDC (Girls, 2-20 Years) data.   Wt Readings from Last 3 Encounters:  09/26/20 158 lb 11.7 oz (72 kg)  04/01/20 150 lb 12.8 oz (68.4 kg) (81 %, Z= 0.86)*  09/25/19 150 lb 12.8 oz (68.4 kg) (82 %, Z= 0.90)*   * Growth percentiles are based on CDC (Girls, 2-20 Years) data.   HC Readings from Last 3 Encounters:  No data found for [facility]   Body surface area is 1.79 meters squared. Facility age limit for growth percentiles is 20 years. Facility age limit for growth percentiles is 20 years.   PHYSICAL EXAM:   Constitutional: The patient appears healthy and well nourished. The patient's height and weight are delayed for age.  She is short for MPH  Weight is + 8 pounds.  Head: The head is normocephalic. Face: The face appears normal. There are no obvious dysmorphic features.  male pattern facial hair that is coarse- mostly on chin Eyes: The eyes appear  to be normally formed and spaced. Gaze is conjugate. There is no obvious arcus or proptosis. Moisture appears normal. Ears: The ears are normally placed and appear externally normal. Mouth: The oropharynx and tongue appear normal. Dentition appears to be normal for age. Oral moisture is normal. Neck: The neck appears to be visibly normal.  The thyroid gland is 13 grams in size. The consistency of the thyroid gland is normal. The thyroid gland is not tender to palpation. Lungs: The lungs are clear to auscultation. Air movement is good. Heart: Heart rate and rhythm are regular. Heart sounds S1 and S2 are normal. I did not appreciate any pathologic cardiac murmurs. Abdomen: The abdomen appears to  be normal in size for the patient's age. Bowel sounds are normal. There is no obvious hepatomegaly, splenomegaly, or other mass effect.  Arms: Muscle size and bulk are normal for age. Hands: There is no obvious tremor. Phalangeal and metacarpophalangeal joints are normal. Palmar muscles are normal for age. Palmar skin is normal. Palmar moisture is also normal. Legs: Muscles appear normal for age. No edema is present. Feet: Feet are normally formed. Dorsalis pedal pulses are normal. Neurologic: Strength is normal for age in both the upper and lower extremities. Muscle tone is normal. Sensation to touch is normal in both the legs and feet.   GYN/GU: Puberty: Tanner stage pubic hair: V Tanner stage breast/genital V.   LAB DATA:   pending No results found for this or any previous visit (from the past 672 hour(s)).    Assessment and Plan:  Assessment  ASSESSMENT: Billy Oliver is a 19 y.o. caucasian transfemale followed for gender affirming hormone therapy.      Transgender - On Estrace, Spironolactone, Prometrium, and Bicalutamide - She has not yet gotten her license gender marker changed- is having issues with finances and costs associated with name change and gender marker change - She has established care  with Junie Bame for therapy but has not continued care  PLAN:   1. Diagnostic: Testosterone, LH, FSH,  Estrogen levels ordered 2. Therapeutic: Increase Estrace to 6 mg daily. Start Depo Provera q3 months. Continue Spironolactone 50 mg daily. Prometrium 100 mg daily. Bicalutamide 50 mg daily. Reviewed need for Vit D [phone] IU/day and Calcium [phone] mg/day. Flu vaccine today.  3. Patient education: Discussion of the above. Also discussed GED.  4. Follow-up: Return in about 6 months (around 03/27/2021).      Dessa Phi, MD  Level of Service: >40 minutes spent today reviewing the medical chart, counseling the patient/family, and documenting today's encounter.     Patient referred by Richrd Sox, MD for gender dysphoria  Copy of this note sent to Richrd Sox, MD

## 2020-09-30 ENCOUNTER — Telehealth (INDEPENDENT_AMBULATORY_CARE_PROVIDER_SITE_OTHER): Payer: Self-pay | Admitting: Pediatric Endocrinology

## 2020-09-30 NOTE — Telephone Encounter (Signed)
  Who's calling (name and relationship to patient) : Billy Oliver (patient)  Best contact number: [phone]  Provider they see: Dr. Vanessa Linton Hall  Reason for call: Patient was unable to get labs done the other day. Requests that orders be sent to [facility] in [place].    PRESCRIPTION REFILL ONLY  Name of prescription:  Pharmacy:

## 2020-10-01 NOTE — Addendum Note (Signed)
Addended by: Vallery Sa on: 10/01/2020 10:25 AM   Modules accepted: Orders

## 2020-10-01 NOTE — Telephone Encounter (Signed)
Labs have been sent. Atte

## 2020-10-22 ENCOUNTER — Encounter (HOSPITAL_COMMUNITY): Payer: Self-pay | Admitting: *Deleted

## 2020-10-22 ENCOUNTER — Emergency Department (HOSPITAL_COMMUNITY): Payer: Medicaid Other

## 2020-10-22 ENCOUNTER — Other Ambulatory Visit: Payer: Self-pay

## 2020-10-22 ENCOUNTER — Emergency Department (HOSPITAL_COMMUNITY)
Admission: EM | Admit: 2020-10-22 | Discharge: 2020-10-23 | Disposition: A | Discharge status: Home / Self Care | Payer: Medicaid Other | Attending: Emergency Medicine | Admitting: Emergency Medicine

## 2020-10-22 DIAGNOSIS — R0789 Other chest pain: Secondary | ICD-10-CM | POA: Diagnosis present

## 2020-10-22 DIAGNOSIS — Z87891 Personal history of nicotine dependence: Secondary | ICD-10-CM | POA: Insufficient documentation

## 2020-10-22 DIAGNOSIS — Z79899 Other long term (current) drug therapy: Secondary | ICD-10-CM | POA: Insufficient documentation

## 2020-10-22 DIAGNOSIS — R079 Chest pain, unspecified: Secondary | ICD-10-CM

## 2020-10-22 DIAGNOSIS — R0602 Shortness of breath: Secondary | ICD-10-CM | POA: Insufficient documentation

## 2020-10-22 LAB — BASIC METABOLIC PANEL
Anion gap: 9 (ref 5–15)
BUN: 12 mg/dL (ref 6–20)
CO2: 22 mmol/L (ref 22–32)
Calcium: 9.4 mg/dL (ref 8.9–10.3)
Chloride: 105 mmol/L (ref 98–111)
Creatinine, Ser: 0.81 mg/dL (ref 0.61–1.24)
GFR, Estimated: >60 mL/min (ref >60)
Glucose, Bld: 85 mg/dL (ref 70–99)
Potassium: 3.6 mmol/L (ref 3.5–5.1)
Sodium: 136 mmol/L (ref 135–145)

## 2020-10-22 LAB — CBC
HCT: 47.4 % (ref 39.0–52.0)
Hemoglobin: 15.5 g/dL (ref 13.0–17.0)
MCH: 29 pg (ref 26.0–34.0)
MCHC: 32.7 g/dL (ref 30.0–36.0)
MCV: 88.8 fL (ref 80.0–100.0)
Platelets: 335 10*3/uL (ref 150–400)
RBC: 5.34 MIL/uL (ref 4.22–5.81)
RDW: 12 % (ref 11.5–15.5)
WBC: 13.3 10*3/uL — ABNORMAL HIGH (ref 4.0–10.5)
nRBC: 0 % (ref 0.0–0.2)

## 2020-10-22 LAB — TROPONIN I (HIGH SENSITIVITY)
Troponin I (High Sensitivity): <2 ng/L (ref <18)
Troponin I (High Sensitivity): <2 ng/L (ref <18)

## 2020-10-22 MED ORDER — OMEPRAZOLE 20 MG PO CPDR: 20.0000 mg | DELAYED_RELEASE_CAPSULE | Freq: Every day | ORAL | 1 refills | Status: AC

## 2020-10-22 MED ORDER — SUCRALFATE 1 GM/10ML PO SUSP
1.0000 g | Freq: Once | ORAL | Status: AC
  Administered 2020-10-22: 1 g via ORAL
  Filled 2020-10-22: qty 10

## 2020-10-22 NOTE — ED Triage Notes (Signed)
Chest pain onset yesterday 

## 2020-10-22 NOTE — Discharge Instructions (Addendum)
You were seen in the emergency department for evaluation of chest pain radiating into your left shoulder. You had blood work EKG and a chest x-ray that did not show any obvious explanation for your symptoms.  Your vital signs were good.  We are putting on some acid medication to see if that may help your symptoms.  We do recommend that you do not smoke.  Follow-up with your primary care doctor and return to the emergency department if any worsening symptoms

## 2020-10-22 NOTE — ED Provider Notes (Signed)
[facility] EMERGENCY DEPARTMENT Provider Note   CSN: [id number] Arrival date & time: 10/22/20  1301     History Chief Complaint  Patient presents with  . Chest Pain    Billy Oliver is a 20 y.o. adult.  She is coming in for evaluation of chest pain on and off that started yesterday.  Has had chest pain before but never lasted longer than 30 minutes.  Was worsening while she was here.  Associated with some shortness of breath and it radiates down the left arm.  Smoker.  On hormone therapy.   Chest Pain Associated symptoms: no abdominal pain, no fever, no headache and no shortness of breath     HPI: A 20 year old patient presents for evaluation of chest pain. Initial onset of pain was more than 6 hours ago. The patient's chest pain is described as heaviness/pressure/tightness and is not worse with exertion. The patient's chest pain is middle- or left-sided, is not well-localized, is not sharp and does radiate to the arms/jaw/neck. The patient does not complain of nausea and denies diaphoresis. The patient has smoked in the past 90 days. The patient has no history of stroke, has no history of peripheral artery disease, denies any history of treated diabetes, has no relevant family history of coronary artery disease (first degree relative at less than age 6), is not hypertensive, has no history of hypercholesterolemia and does not have an elevated BMI (>=30).   Past Medical History:  Diagnosis Date  . ADHD (attention deficit hyperactivity disorder)   . Anxiety   . Behavioral problems 06/21/2013  . Scoliosis     Patient Active Problem List   Diagnosis Date Noted  . Transgender 09/21/2018  . 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 Hirsutism 02/08/2017  . Emotional stress 09/18/2015  . Behavioral problems 06/21/2013    History reviewed. No pertinent surgical history.     Family History  Problem Relation Age of Onset  . Diabetes Mother   . Hypertension Mother   . Neuropathy Mother   .  Hyperlipidemia Mother   . Healthy Sister   . Heart disease Maternal Grandmother   . Cancer Maternal Grandfather   . Stroke Maternal Grandfather   . Diabetes Paternal Grandmother   . Stroke Paternal Grandmother   . Aneurysm Paternal Grandfather   . Cancer Paternal Grandfather   . Drug abuse Father     Social History   Tobacco Use  . Smoking status: Former Research scientist (life sciences)  . Smokeless tobacco: Never Used  Vaping Use  . Vaping Use: Every day  Substance Use Topics  . Alcohol use: No  . Drug use: Yes    Types: Marijuana    Home Medications Prior to Admission medications   Medication Sig Start Date End Date Taking? Authorizing Provider  bicalutamide (CASODEX) 50 MG tablet TAKE 1 TABLET BY MOUTH EVERY DAY 05/30/20   Lelon Huh, MD  estradiol (ESTRACE) 2 MG tablet Take 1 tablet (2 mg total) by mouth 3 (three) times daily. 09/26/20   Lelon Huh, MD  medroxyPROGESTERone (DEPO-PROVERA) 150 MG/ML injection Inject 1 mL (150 mg total) into the muscle every 3 (three) months. 09/26/20   Lelon Huh, MD  progesterone (PROMETRIUM) 100 MG capsule TAKE 1 CAPSULE BY MOUTH EVERY DAY 08/02/20   Lelon Huh, MD  spironolactone (ALDACTONE) 50 MG tablet TAKE 1 TABLET BY MOUTH EVERY DAY 11/27/19   Lelon Huh, MD    Allergies    Patient has no known allergies.  Review of Systems   Review  of Systems  Constitutional: Negative for fever.  HENT: Negative for sore throat.   Eyes: Negative for visual disturbance.  Respiratory: Negative for shortness of breath.   Cardiovascular: Positive for chest pain.  Gastrointestinal: Negative for abdominal pain.  Genitourinary: Negative for dysuria.  Musculoskeletal: Negative for neck pain.  Skin: Negative for rash.  Neurological: Negative for headaches.    Physical Exam Updated Vital Signs BP 116/79 (BP Location: Right Arm)   Pulse 67   Temp 98.1 F (36.7 C) (Oral)   Resp 18   SpO2 100%   Physical Exam Vitals and nursing note reviewed.   Constitutional:      Appearance: She is well-developed and well-nourished.  HENT:     Head: Normocephalic and atraumatic.  Eyes:     Conjunctiva/sclera: Conjunctivae normal.  Cardiovascular:     Rate and Rhythm: Normal rate and regular rhythm.     Heart sounds: Normal heart sounds.  Pulmonary:     Effort: Pulmonary effort is normal.  Abdominal:     Palpations: Abdomen is soft. There is no mass.     Tenderness: There is no abdominal tenderness.  Musculoskeletal:        General: Normal range of motion.     Cervical back: Neck supple.     Right lower leg: No tenderness. No edema.     Left lower leg: No tenderness. No edema.  Skin:    General: Skin is warm and dry.     Capillary Refill: Capillary refill takes less than 2 seconds.  Neurological:     General: No focal deficit present.     Mental Status: She is alert.     GCS: GCS eye subscore is 4. GCS verbal subscore is 5. GCS motor subscore is 6.  Psychiatric:        Mood and Affect: Mood and affect normal.     ED Results / Procedures / Treatments   Labs (all labs ordered are listed, but only abnormal results are displayed) Labs Reviewed  CBC - Abnormal; Notable for the following components:      Result Value   WBC 13.3 (*)    All other components within normal limits  BASIC METABOLIC PANEL  TROPONIN I (HIGH SENSITIVITY)  TROPONIN I (HIGH SENSITIVITY)    EKG EKG Interpretation  Date/Time:  Tuesday October 22 2020 14:21:59 EST Ventricular Rate:  74 PR Interval:  118 QRS Duration: 74 QT Interval:  368 QTC Calculation: 408 R Axis:   98 Text Interpretation: Normal sinus rhythm with sinus arrhythmia Rightward axis Nonspecific T wave abnormality Abnormal ECG No old tracing to compare Confirmed by Meridee Score [phone]) on 10/22/2020 3:08:59 PM   Radiology DG Chest Port 1 View  Result Date: 10/22/2020 CLINICAL DATA:  Chest pain EXAM: PORTABLE CHEST 1 VIEW COMPARISON:  03/05/2018 FINDINGS: The heart size and  mediastinal contours are within normal limits. Both lungs are clear. The visualized skeletal structures are unremarkable. IMPRESSION: No active disease. Electronically Signed   By: Helyn Numbers MD   On: 10/22/2020 23:31    Procedures Procedures (including critical care time)  Medications Ordered in ED Medications  sucralfate (CARAFATE) 1 GM/10ML suspension 1 g (has no administration in time range)    ED Course  I have reviewed the triage vital signs and the nursing notes.  Pertinent labs & imaging results that were available during my care of the patient were reviewed by me and considered in my medical decision making (see chart for details).  Clinical  Course as of 10/23/20 1033  Tue Oct 22, 2020  2251 Patient is not PERC negative due to being on hormone therapy but otherwise has no evidence of DVT or PE.  Not tachycardic not tachypneic not hypoxic and no leg swelling or tenderness.  Pain is not pleuritic in nature. [MB]  2252 Delta troponin negative.  Does have a mildly elevated white count. [MB]  2312 Carafate slurry with some slight improvement in her discomfort.  Currently rates it as a 2 out of 10. [MB]  2330 Chest x-ray interpreted by me as no acute infiltrates. [MB]    Clinical Course User Index [MB] Terrilee Files, MD   MDM Rules/Calculators/A&P HEAR Score: 3                       This patient complains of pain radiating to left shoulder and arm; this involves an extensive number of treatment Options and is a complaint that carries with it a high risk of complications and Morbidity. The differential includes ACS, pneumonia, PE, pneumothorax, musculoskeletal, GERD  I ordered, reviewed and interpreted labs, which included CBC with mildly elevated white count, normal hemoglobin, chemistries normal including normal sugar, troponins flat I ordered medication Carafate with some improvement in her symptoms I ordered imaging studies which included chest x-ray and I  independently    visualized and interpreted imaging which showed no acute infiltrate no pneumothorax Previous records obtained and reviewed in epic, no prior cardiac complaints  After the interventions stated above, I reevaluated the patient and found patient to be hemodynamically stable appears comfortable.  She states her pain is a 2 or 3 out of 10 currently recommended close follow-up with PCP and will put her on a PPI.  She is comfortable plan and return instructions discussed   Final Clinical Impression(s) / ED Diagnoses Final diagnoses:  Nonspecific chest pain    Rx / DC Orders ED Discharge Orders         Ordered    omeprazole (PRILOSEC) 20 MG capsule  Daily        10/22/20 2313           Terrilee Files, MD 10/23/20 1035

## 2020-10-23 ENCOUNTER — Telehealth (INDEPENDENT_AMBULATORY_CARE_PROVIDER_SITE_OTHER): Payer: Self-pay | Admitting: Pediatric Endocrinology

## 2020-10-23 DIAGNOSIS — R079 Chest pain, unspecified: Secondary | ICD-10-CM

## 2020-10-23 NOTE — Telephone Encounter (Signed)
Seen in ED 2 days ago for chest pain.   Had labs and an EKG. She says that everything was normal other than increased WBC.   She feels that her left arm is heavy- about 1 pound more than she thinks it should feel (shoulder to elbow). No swelling at wrist or hand/finger. She thinks that her arms are the same circumference but the left is just heavier.   She is no longer having pain in her arm or her chest. She does have some mild discomfort.   Admission on 10/22/2020, Discharged on 10/23/2020  Component Date Value Ref Range Status  . Sodium 10/22/2020 136  135 - 145 mmol/L Final  . Potassium 10/22/2020 3.6  3.5 - 5.1 mmol/L Final  . Chloride 10/22/2020 105  98 - 111 mmol/L Final  . CO2 10/22/2020 22  22 - 32 mmol/L Final  . Glucose, Bld 10/22/2020 85  70 - 99 mg/dL Final   Glucose reference range applies only to samples taken after fasting for at least 8 hours.  . BUN 10/22/2020 12  6 - 20 mg/dL Final  . Creatinine, Ser 10/22/2020 0.81  0.61 - 1.24 mg/dL Final  . Calcium 76/54/6503 9.4  8.9 - 10.3 mg/dL Final  . GFR, Estimated 10/22/2020 >60  >60 mL/min Final   Comment: (NOTE) Calculated using the CKD-EPI Creatinine Equation (2021)   . Anion gap 10/22/2020 9  5 - 15 Final   Performed at [facility], [address], [place], [place] [address]  . WBC 10/22/2020 13.3* 4.0 - 10.5 K/uL Final  . RBC 10/22/2020 5.34  4.22 - 5.81 MIL/uL Final  . Hemoglobin 10/22/2020 15.5  13.0 - 17.0 g/dL Final  . HCT 81/27/5170 47.4  39.0 - 52.0 % Final  . MCV 10/22/2020 88.8  80.0 - 100.0 fL Final  . MCH 10/22/2020 29.0  26.0 - 34.0 pg Final  . MCHC 10/22/2020 32.7  30.0 - 36.0 g/dL Final  . RDW 01/74/9449 12.0  11.5 - 15.5 % Final  . Platelets 10/22/2020 335  150 - 400 K/uL Final  . nRBC 10/22/2020 0.0  0.0 - 0.2 % Final   Performed at [facility], [address], [place], [place] [address]  . Troponin I (High Sensitivity) 10/22/2020 <2  <18 ng/L Final   Comment: (NOTE) Elevated high sensitivity  troponin I (hsTnI) values and significant  changes across serial measurements may suggest ACS but many other  chronic and acute conditions are known to elevate hsTnI results.  Refer to the "Links" section for chest pain algorithms and additional  guidance. Performed at [facility], [address], [place], [place] [address]   . Troponin I (High Sensitivity) 10/22/2020 <2  <18 ng/L Final   Comment: (NOTE) Elevated high sensitivity troponin I (hsTnI) values and significant  changes across serial measurements may suggest ACS but many other  chronic and acute conditions are known to elevate hsTnI results.  Refer to the "Links" section for chest pain algorithms and additional  guidance. Performed at [facility], [address] [place], [place] [address]    Will order clotting factors to see if potentially issue from her estrogen treatment.  Will hold estrogen doses pending labs.  If increase in pain or crushing feeling in chest -> Return to ER!  Koren Shiver

## 2020-10-23 NOTE — Telephone Encounter (Signed)
Who's calling (name and relationship to patient) : Billy Oliver (self)  Best contact number: [phone]  Provider they see: Dr. Vanessa Gotebo  Reason for call:  Billy Oliver called in stating that on Monday she began having chest pains, sometimes she will have these chest pains that resolve within 30 mins but this occurrence did not. States she went to the hospital where they told her it was just acid reflux, did take Tums to help but did not. She is not currently having those pains but is tightening up and has shortness of breath, left arm is feeling heavier then normal. States chest is not hurting currently but there discomfort in left shoulder, 2 out of 10 pain. Needs to know if is still okay to take medication.     Writer sent phone call to Dr. Vanessa Zillah for additonal support.   Call ID:      PRESCRIPTION REFILL ONLY  Name of prescription:  Pharmacy:

## 2020-10-24 LAB — PROTIME-INR
INR: 1.1 (ref 0.9–1.2)
Prothrombin Time: 11.1 s (ref 9.1–12.0)

## 2020-10-24 LAB — D-DIMER, QUANTITATIVE: D-DIMER: 0.23 mg/L FEU (ref 0.00–0.49)

## 2020-10-24 LAB — APTT: aPTT: 31 s (ref 24–33)

## 2020-12-18 ENCOUNTER — Ambulatory Visit (INDEPENDENT_AMBULATORY_CARE_PROVIDER_SITE_OTHER): Payer: Medicaid Other

## 2020-12-18 ENCOUNTER — Other Ambulatory Visit: Payer: Self-pay

## 2020-12-18 VITALS — HR 90 | Temp 97.8°F | Wt 150.4 lb

## 2020-12-18 DIAGNOSIS — Z789 Other specified health status: Secondary | ICD-10-CM | POA: Diagnosis not present

## 2020-12-18 MED ORDER — MEDROXYPROGESTERONE ACETATE 150 MG/ML IM SUSP
150.0000 mg | Freq: Once | INTRAMUSCULAR | Status: AC
  Administered 2020-12-18: 150 mg via INTRAMUSCULAR

## 2020-12-18 NOTE — Progress Notes (Signed)
.   Name of Medication:  MedroxyProgesterone Acetate  . NDC number: 1751-0258-52  . Lot Number: [id number]  . Expiration Date: 01/2022  . Who administered the injection? Angelene Giovanni, RN  . Administration Site:  Left Arm  .  Patient supplied: Yes  . Was the patient observed for 10-15 minutes after injection was given? No . If not, why?  Patient has gotten dose before without issue  . Was there an adverse reaction after giving medication? No . If yes, what reaction?

## 2021-03-30 NOTE — Progress Notes (Signed)
Subjective:  Subjective  Patient Name: Billy Oliver Date of Birth: [birth year]  MRN: [id number]  Billy Oliver  presents to clinic today for follow up evaluation and management of her gender dysphoria  HISTORY OF PRESENT ILLNESS:   Billy Oliver is a 20 y.o. transfemale   Billy Oliver was accompanied by herself   1. Billy Oliver was seen by her PCP in March 2018. At that visit they revisited previous concerns about gender identity. She had previously been referred to adolescent medicine in October 2017. Mom was unable to bring her to that appointment and the referral was dropped. She was seen in endocrine clinic in April 2018 and then was lost to follow up.   2. Billy Oliver was last seen in pediatric endocrine clinic on 09/26/20. In the interim she has been generally healthy.   She has her depo provera with her today- for doing.   She bought her dream car, had a bf, and a great job. Now all she has is the car. She lost her job 2 weeks ago. 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 She lost her job for losing a container of baby formula in her car.   She feels that her BF left because he wanted to be with a biologic male.   She also has 2 speeding tickets to pay for.   She is living with her sister.   She doesn't currently have a plan for what she wants to do next.   She says that she is grieving. She says that is not "right in the head". She is thinking about suicide "all the time". She says that she thinks that she should be admitted but she is worried that she will not be well treated inpatient. She says that she really needs to talks to someone. She has never been this depressed in her life.   She is comfortable driving herself to the [facility] on [address]. I did call over to the center and spoke with intake there. They were able to assure me that Billy Oliver would not be their first transgender patient and that they are very comfortable with using correct names and  pronouns. I was also able to assure Billy Oliver that, at least on the adolescent ward, patients are housed according to their affirmed gender. I would be surprised if the adult wards did not operate the same way.    --------------- She is not smoking. She has not been smoking since 2020.   She feels that her breasts are a little larger - she is now a 36 B.   She has continued on Estrace 4 mg, progesterone and spironolactone, and bicalutamide.   She had been waxing her face- she stopped because she was breaking out too much. She is now back to shaving daily.   She has been taking Vit C.  She is not currently taking Vit D or Calcium.    3. Pertinent Review of Systems:  Constitutional: The patient feels "terrible".  Eyes: Vision seems to be good. There are no recognized eye problems. Wears glasses.  Neck: The patient has no complaints of anterior neck swelling, soreness, tenderness, pressure, discomfort, or difficulty swallowing.   Heart: Heart rate increases with exercise or other physical activity. The patient has no complaints of palpitations, irregular heart beats, chest pain, or chest pressure.   Lungs: No asthma, or SOB Gastrointestinal: Bowel movents seem normal. The patient has no complaints of excessive hunger, acid reflux, upset stomach, stomach aches or pains,  diarrhea, or constipation.  Legs: Muscle mass and strength seem normal. There are no complaints of numbness, tingling, burning, or pain. No edema is noted.  Feet: There are no obvious foot problems. There are no complaints of numbness, tingling, burning, or pain. No edema is noted. Neurologic: There are no recognized problems with muscle movement and strength, sensation, or coordination. GYN/GU:  Per HPI Skin: per HPI no acne.   PAST MEDICAL, FAMILY, AND SOCIAL HISTORY  Past Medical History:  Diagnosis Date   ADHD (attention deficit hyperactivity disorder)    Anxiety    Behavioral problems 06/21/2013   Scoliosis      Family History  Problem Relation Age of Onset   Diabetes Mother    Hypertension Mother    Neuropathy Mother    Hyperlipidemia Mother    Healthy Sister    Heart disease Maternal Grandmother    Cancer Maternal Grandfather    Stroke Maternal Grandfather    Diabetes Paternal Grandmother    Stroke Paternal Grandmother    Aneurysm Paternal Grandfather    Cancer Paternal Grandfather    Drug abuse Father      Current Outpatient Medications:    bicalutamide (CASODEX) 50 MG tablet, TAKE 1 TABLET BY MOUTH EVERY DAY, Disp: 30 tablet, Rfl: 5   estradiol (ESTRACE) 2 MG tablet, Take 1 tablet (2 mg total) by mouth 3 (three) times daily., Disp: 270 tablet, Rfl: 1   medroxyPROGESTERone (DEPO-PROVERA) 150 MG/ML injection, INJECT 1 ML (150 MG TOTAL) INTO THE MUSCLE EVERY 3 (THREE) MONTHS., Disp: 1 mL, Rfl: 3   progesterone (PROMETRIUM) 100 MG capsule, TAKE 1 CAPSULE BY MOUTH EVERY DAY, Disp: 90 capsule, Rfl: 1   spironolactone (ALDACTONE) 50 MG tablet, TAKE 1 TABLET BY MOUTH EVERY DAY, Disp: 30 tablet, Rfl: 5   omeprazole (PRILOSEC) 20 MG capsule, Take 1 capsule (20 mg total) by mouth daily. (Patient not taking: Reported on 03/31/2021), Disp: 30 capsule, Rfl: 1  Allergies as of 03/31/2021   (Not on File)     reports that she has quit smoking. She has never used smokeless tobacco. She reports current drug use. Drug: Marijuana. She reports that she does not drink alcohol. Pediatric History  Patient Parents   Otis Peak (Mother)   Other Topics Concern   Not on file  Social History Narrative   Not on file    1. School and Family: Lives with sister   She hasn't gotten her GED. 2. Activities: not active  3. Primary Care Provider: Pcp, No  ROS: There are no other significant problems involving Romeo's other body systems.    Objective:  Objective  Vital Signs:   BP 102/70 (BP Location: Right Arm, Patient Position: Sitting, Cuff Size: Normal)   Pulse 84   Wt 151 lb 6.4 oz (68.7 kg)    BMI 26.99 kg/m   Growth percentile SmartLinks can only be used for patients less than 16 years old.  Ht Readings from Last 3 Encounters:  09/25/19 5' 2.8" (1.595 m) (28 %, Z= -0.59)*  12/21/18 5' 2.6" (1.59 m) (26 %, Z= -0.65)*  09/21/18 5' 2.68" (1.592 m) (27 %, Z= -0.62)*   * Growth percentiles are based on CDC (Girls, 2-20 Years) data.   Wt Readings from Last 3 Encounters:  03/31/21 151 lb 6.4 oz (68.7 kg)  12/18/20 150 lb 6.4 oz (68.2 kg)  09/26/20 158 lb 11.7 oz (72 kg)   HC Readings from Last 3 Encounters:  No data found for [facility]   Body  surface area is 1.74 meters squared. Facility age limit for growth percentiles is 20 years. Facility age limit for growth percentiles is 20 years.   PHYSICAL EXAM:   Constitutional: The patient appears healthy and well nourished. The patient's height and weight are delayed for age.  She is short for MPH  Weight is - 7 pounds.  Head: The head is normocephalic. Face: The face appears normal. There are no obvious dysmorphic features.  male pattern facial hair that is coarse- mostly on chin Eyes: The eyes appear to be normally formed and spaced. Gaze is conjugate. There is no obvious arcus or proptosis. Moisture appears normal. Ears: The ears are normally placed and appear externally normal. Mouth: The oropharynx and tongue appear normal. Dentition appears to be normal for age. Oral moisture is normal. Neck: The neck appears to be visibly normal.  The thyroid gland is 13 grams in size. The consistency of the thyroid gland is normal. The thyroid gland is not tender to palpation. Lungs: The lungs are clear to auscultation. Air movement is good. Heart: Heart rate and rhythm are regular. Heart sounds S1 and S2 are normal. I did not appreciate any pathologic cardiac murmurs. Abdomen: The abdomen appears to be normal in size for the patient's age. Bowel sounds are normal. There is no obvious hepatomegaly, splenomegaly, or other mass effect.  Arms: Muscle  size and bulk are normal for age. Hands: There is no obvious tremor. Phalangeal and metacarpophalangeal joints are normal. Palmar muscles are normal for age. Palmar skin is normal. Palmar moisture is also normal. Legs: Muscles appear normal for age. No edema is present. Feet: Feet are normally formed. Dorsalis pedal pulses are normal. Neurologic: Strength is normal for age in both the upper and lower extremities. Muscle tone is normal. Sensation to touch is normal in both the legs and feet.   GYN/GU: Puberty: Tanner stage pubic hair: V Tanner stage breast/genital V.   LAB DATA:   pending No results found for this or any previous visit (from the past 672 hour(s)).    Assessment and Plan:  Assessment  ASSESSMENT: Billy Oliver is a 20 y.o. caucasian transfemale followed for gender affirming hormone therapy.      Transgender - On Estrace, Spironolactone, Prometrium, and Bicalutamide - She has not yet gotten her license gender marker changed- is having issues with finances and costs associated with name change and gender marker change - She has previously established care with Junie Bame for therapy but has not continued care - Reports multiple life stressors and need for emergent psychiatric assistance due to suicidal ideation and perseveration.   PLAN:   1. Diagnostic: none 2. Therapeutic: Increase Estrace to 6 mg daily. Start Depo Provera q3 months. Continue Spironolactone 50 mg daily. Prometrium 100 mg daily. Bicalutamide 50 mg daily.  3. Patient education: Discussion of the above. Patient was able to contract for safety and assured me that she would drive directly to behavioral health from this visit.   4. Follow-up: Return in about 1 month (around 04/30/2021).      Dessa Phi, MD  Level of Service: >40 minutes spent today reviewing the medical chart, counseling the patient/family, and documenting today's encounter.      Patient referred by Richrd Sox, MD for gender  dysphoria  Copy of this note sent to Pcp, No

## 2021-03-31 ENCOUNTER — Ambulatory Visit (INDEPENDENT_AMBULATORY_CARE_PROVIDER_SITE_OTHER): Payer: Medicaid Other | Admitting: Pediatric Endocrinology

## 2021-03-31 ENCOUNTER — Other Ambulatory Visit: Payer: Self-pay

## 2021-03-31 ENCOUNTER — Ambulatory Visit (HOSPITAL_COMMUNITY)
Admission: EM | Admit: 2021-03-31 | Discharge: 2021-03-31 | Disposition: A | Discharge status: Home / Self Care | Payer: Medicaid Other

## 2021-03-31 ENCOUNTER — Other Ambulatory Visit (HOSPITAL_COMMUNITY): Payer: Self-pay

## 2021-03-31 ENCOUNTER — Encounter (INDEPENDENT_AMBULATORY_CARE_PROVIDER_SITE_OTHER): Payer: Self-pay | Admitting: Pediatric Endocrinology

## 2021-03-31 VITALS — BP 102/70 | HR 84 | Wt 151.4 lb

## 2021-03-31 DIAGNOSIS — R45851 Suicidal ideations: Secondary | ICD-10-CM | POA: Diagnosis not present

## 2021-03-31 DIAGNOSIS — Z789 Other specified health status: Secondary | ICD-10-CM | POA: Diagnosis not present

## 2021-03-31 MED ORDER — MEDROXYPROGESTERONE ACETATE 150 MG/ML IM SUSP
150.0000 mg | Freq: Once | INTRAMUSCULAR | Status: AC
  Administered 2021-03-31: 15:00:00 150 mg via INTRAMUSCULAR

## 2021-03-31 MED FILL — Medroxyprogesterone Acetate IM Susp 150 MG/ML: INTRAMUSCULAR | 84 days supply | Qty: 1 | Fill #0 | Status: AC

## 2021-03-31 NOTE — BH Assessment (Signed)
Patient presents to [facility] looking for outpatient provider. Patient denied SI/ HI/ AVH . Patient declined seeing provider signed EMTALA  and received information for outpatient on 2nd floor . Patient left after completed registration.

## 2021-03-31 NOTE — Patient Instructions (Signed)
   Behavioral Health  [address] [place], [place] [address]

## 2021-03-31 NOTE — Progress Notes (Signed)
Name of Medication:   medroxyprogesterone   NDC number:0703-6801-01  Lot Number:  [id number]  Expiration Date:  042023  Who administered the injection? Angelene Giovanni, RN  Administration Site:  Left Arm   Patient supplied: Yes/No   Was the patient observed for 10-15 minutes after injection was given? Yes If not, why?   Was there an adverse reaction after giving medication? No If yes, what reaction?

## 2021-04-01 ENCOUNTER — Encounter (INDEPENDENT_AMBULATORY_CARE_PROVIDER_SITE_OTHER): Payer: Self-pay

## 2021-04-28 ENCOUNTER — Ambulatory Visit (INDEPENDENT_AMBULATORY_CARE_PROVIDER_SITE_OTHER): Payer: Medicaid Other | Admitting: Pediatric Endocrinology

## 2021-09-23 ENCOUNTER — Other Ambulatory Visit (INDEPENDENT_AMBULATORY_CARE_PROVIDER_SITE_OTHER): Payer: Self-pay | Admitting: Pediatric Endocrinology

## 2021-11-03 ENCOUNTER — Encounter: Payer: Self-pay | Admitting: Emergency Medicine

## 2021-11-03 ENCOUNTER — Other Ambulatory Visit: Payer: Self-pay

## 2021-11-03 ENCOUNTER — Ambulatory Visit
Admission: EM | Admit: 2021-11-03 | Discharge: 2021-11-03 | Disposition: A | Discharge status: Home / Self Care | Payer: Medicaid Other | Attending: Urgent Care | Admitting: Urgent Care

## 2021-11-03 DIAGNOSIS — R052 Subacute cough: Secondary | ICD-10-CM

## 2021-11-03 DIAGNOSIS — B349 Viral infection, unspecified: Secondary | ICD-10-CM | POA: Diagnosis not present

## 2021-11-03 DIAGNOSIS — R070 Pain in throat: Secondary | ICD-10-CM | POA: Diagnosis not present

## 2021-11-03 LAB — POCT RAPID STREP A (OFFICE): Rapid Strep A Screen: NEGATIVE

## 2021-11-03 MED ORDER — BENZONATATE 100 MG PO CAPS: 100.0000 mg | ORAL_CAPSULE | Freq: Three times a day (TID) | ORAL | 0 refills | Status: DC | PRN

## 2021-11-03 MED ORDER — PSEUDOEPHEDRINE HCL 60 MG PO TABS
60.0000 mg | ORAL_TABLET | Freq: Three times a day (TID) | ORAL | 0 refills | Status: AC | PRN
Start: 2021-11-03 — End: ?

## 2021-11-03 MED ORDER — CETIRIZINE HCL 10 MG PO TABS: 10.0000 mg | ORAL_TABLET | Freq: Every day | ORAL | 0 refills | Status: AC

## 2021-11-03 MED ORDER — PROMETHAZINE-DM 6.25-15 MG/5ML PO SYRP: 5.0000 mL | ORAL_SOLUTION | Freq: Every evening | ORAL | 0 refills | Status: AC | PRN

## 2021-11-03 NOTE — ED Triage Notes (Signed)
Pt reports fatigue, chills, neck soreness, sore throat, right ear pain since Friday night. Pt reports no known exposures to illness other than family member with "walking pneumonia."

## 2021-11-03 NOTE — ED Provider Notes (Signed)
[facility]   MRN: [id number] DOB: 11-Nov-2000  Subjective:   Billy Oliver is a 21 y.o. adult presenting for 4 day history of acute onset persistent malaise, fever, right ear pain, throat pain, slight cough. No chest pain, shob, ear drainage, wheezing. Had 1 sick contact with walking pneumonia. Patient vapes, has occasional marijuana.  No history of respiratory disorders.  No current facility-administered medications for this encounter.  Current Outpatient Medications:    bicalutamide (CASODEX) 50 MG tablet, TAKE 1 TABLET BY MOUTH EVERY DAY, Disp: 30 tablet, Rfl: 5   estradiol (ESTRACE) 2 MG tablet, TAKE 1 TABLET BY MOUTH 3 TIMES DAILY., Disp: 270 tablet, Rfl: 1   medroxyPROGESTERone (DEPO-PROVERA) 150 MG/ML injection, INJECT 1 ML (150 MG TOTAL) INTO THE MUSCLE EVERY 3 (THREE) MONTHS., Disp: 1 mL, Rfl: 3   omeprazole (PRILOSEC) 20 MG capsule, Take 1 capsule (20 mg total) by mouth daily. (Patient not taking: Reported on 03/31/2021), Disp: 30 capsule, Rfl: 1   progesterone (PROMETRIUM) 100 MG capsule, TAKE 1 CAPSULE BY MOUTH EVERY DAY, Disp: 90 capsule, Rfl: 1   spironolactone (ALDACTONE) 50 MG tablet, TAKE 1 TABLET BY MOUTH EVERY DAY, Disp: 30 tablet, Rfl: 5   Not on File  Past Medical History:  Diagnosis Date   ADHD (attention deficit hyperactivity disorder)    Anxiety    Behavioral problems 06/21/2013   Scoliosis      History reviewed. No pertinent surgical history.  Family History  Problem Relation Age of Onset   Diabetes Mother    Hypertension Mother    Neuropathy Mother    Hyperlipidemia Mother    Healthy Sister    Heart disease Maternal Grandmother    Cancer Maternal Grandfather    Stroke Maternal Grandfather    Diabetes Paternal Grandmother    Stroke Paternal Grandmother    Aneurysm Paternal Grandfather    Cancer Paternal Grandfather    Drug abuse Father     Social History   Tobacco Use   Smoking status: Former   Smokeless tobacco: Never   Building services engineer Use: Every day  Substance Use Topics   Alcohol use: No   Drug use: Yes    Types: Marijuana    ROS   Objective:   Vitals: BP 127/77 (BP Location: Right Arm)    Pulse 96    Temp 98.5 F (36.9 C) (Oral)    Resp 18    Ht 5\' 1"  (1.549 m)    Wt 150 lb (68 kg)    SpO2 95%    BMI 28.34 kg/m   Physical Exam Constitutional:      General: She is not in acute distress.    Appearance: Normal appearance. She is well-developed and normal weight. She is not ill-appearing, toxic-appearing or diaphoretic.  HENT:     Head: Normocephalic and atraumatic.     Right Ear: Tympanic membrane, ear canal and external ear normal. No drainage, swelling or tenderness. No middle ear effusion. There is no impacted cerumen. Tympanic membrane is not erythematous.     Left Ear: Tympanic membrane, ear canal and external ear normal. No drainage, swelling or tenderness.  No middle ear effusion. There is no impacted cerumen. Tympanic membrane is not erythematous.     Nose: Nose normal. No congestion or rhinorrhea.     Mouth/Throat:     Mouth: Mucous membranes are moist. No oral lesions.     Pharynx: No pharyngeal swelling, oropharyngeal exudate, posterior oropharyngeal erythema or uvula swelling.  Tonsils: No tonsillar exudate or tonsillar abscesses.  Eyes:     General: No scleral icterus.       Right eye: No discharge.        Left eye: No discharge.     Extraocular Movements: Extraocular movements intact.     Right eye: Normal extraocular motion.     Left eye: Normal extraocular motion.     Conjunctiva/sclera: Conjunctivae normal.  Cardiovascular:     Rate and Rhythm: Normal rate and regular rhythm.     Pulses: Normal pulses.     Heart sounds: Normal heart sounds. No murmur heard.   No friction rub. No gallop.  Pulmonary:     Effort: Pulmonary effort is normal. No respiratory distress.     Breath sounds: Normal breath sounds. No stridor. No wheezing, rhonchi or rales.   Musculoskeletal:     Cervical back: Normal range of motion and neck supple.  Lymphadenopathy:     Cervical: No cervical adenopathy.  Skin:    General: Skin is warm and dry.     Findings: No rash.  Neurological:     General: No focal deficit present.     Mental Status: She is alert and oriented to person, place, and time.  Psychiatric:        Mood and Affect: Mood normal.        Behavior: Behavior normal.        Thought Content: Thought content normal.    Results for orders placed or performed during the hospital encounter of 11/03/21 (from the past 24 hour(s))  POCT rapid strep A     Status: None   Collection Time: 11/03/21  6:08 PM  Result Value Ref Range   Rapid Strep A Screen Negative Negative    Assessment and Plan :   PDMP not reviewed this encounter.  1. Acute viral syndrome   2. Throat pain   3. Subacute cough    Deferred imaging given clear cardiopulmonary exam, hemodynamically stable vital signs.  Will manage for viral illness such as viral URI, viral syndrome, viral rhinitis, COVID-19, viral pharyngitis, influenza. Recommended supportive care. Offered scripts for symptomatic relief. COVID 19 and strep culture are pending. Counseled patient on potential for adverse effects with medications prescribed/recommended today, ER and return-to-clinic precautions discussed, patient verbalized understanding.      Wallis Bamberg, PA-C 11/03/21 1821

## 2021-11-03 NOTE — Discharge Instructions (Addendum)
We will notify you of your test results as they arrive and may take between 48-72 hours.  I encourage you to sign up for MyChart if you have not already done so as this can be the easiest way for us to communicate results to you online or through a phone app.  Generally, we only contact you if it is a positive test result.  In the meantime, if you develop worsening symptoms including fever, chest pain, shortness of breath despite our current treatment plan then please report to the emergency room as this may be a sign of worsening status from possible viral infection.  Otherwise, we will manage this as a viral syndrome. For sore throat or cough try using a honey-based tea. Use 3 teaspoons of honey with juice squeezed from half lemon. Place shaved pieces of ginger into 1/2-1 cup of water and warm over stove top. Then mix the ingredients and repeat every 4 hours as needed. Please take Tylenol 500mg-650mg every 6 hours for aches and pains, fevers. Hydrate very well with at least 2 liters of water. Eat light meals such as soups to replenish electrolytes and soft fruits, veggies. Start an antihistamine like Zyrtec for postnasal drainage, sinus congestion.  You can take this together with pseudoephedrine (Sudafed) at a dose of 60 mg 2-3 times a day as needed for the same kind of congestion.  Use cough medications as needed.  

## 2021-11-04 LAB — COVID-19, FLU A+B NAA
Influenza A, NAA: NOT DETECTED
Influenza B, NAA: NOT DETECTED
SARS-CoV-2, NAA: NOT DETECTED

## 2021-11-10 ENCOUNTER — Encounter (INDEPENDENT_AMBULATORY_CARE_PROVIDER_SITE_OTHER): Payer: Self-pay | Admitting: Pediatric Endocrinology

## 2021-11-11 ENCOUNTER — Ambulatory Visit
Admission: EM | Admit: 2021-11-11 | Discharge: 2021-11-11 | Disposition: A | Discharge status: Home / Self Care | Payer: Medicaid Other | Attending: Family Medicine | Admitting: Family Medicine

## 2021-11-11 ENCOUNTER — Other Ambulatory Visit: Payer: Self-pay

## 2021-11-11 DIAGNOSIS — R051 Acute cough: Secondary | ICD-10-CM | POA: Diagnosis not present

## 2021-11-11 DIAGNOSIS — H1032 Unspecified acute conjunctivitis, left eye: Secondary | ICD-10-CM

## 2021-11-11 MED ORDER — POLYMYXIN B-TRIMETHOPRIM 10000-0.1 UNIT/ML-% OP SOLN: 1.0000 [drp] | Freq: Four times a day (QID) | OPHTHALMIC | 0 refills | Status: AC

## 2021-11-11 NOTE — ED Triage Notes (Signed)
Patient states she was here on the 16th of this month with severe respiratory infection  Patient states that her left eye became infected with something, it is red and a little itchy and burning.  Patient states she is still congested with cough and green mucus and her throat is hurting still.   Patient states she took PM Mucinex tablets and her prescribed meds from the last visit   Denies fever

## 2021-11-11 NOTE — ED Provider Notes (Signed)
[facility]    CSN: [id number] Arrival date & time: 11/11/21  0901      History   Chief Complaint Chief Complaint  Patient presents with   Nasal Congestion    Both eyes are irritated and some congestion    HPI Billy Oliver is a 21 y.o. adult.   Presenting today with concern over 3-day history of left eye itching, irritation, thick drainage and matting now slightly starting in the right side.  She states she has been dealing with an upper respiratory infection for the past week and a half which is slowly resolving though does have a lingering dry hacking cough.  Taking Mucinex, Sudafed, antihistamines, cough syrup for these with mild temporary relief.  Denies fever, chills, body aches, chest pain, shortness of breath, abdominal pain, nausea vomiting or diarrhea.  No known sick contacts recently.  Denies any visual changes, headaches with the eye symptoms.   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 No pertinent surgical history.     Home Medications    Prior to Admission medications   Medication Sig Start Date End Date Taking? Authorizing Provider  trimethoprim-polymyxin b (POLYTRIM) ophthalmic solution Place 1 drop into both eyes every 6 (six) hours. 11/11/21  Yes Particia NearingLane, Ugo Thoma Elizabeth, PA-C  benzonatate (TESSALON) 100 MG capsule Take 1-2 capsules (100-200 mg total) by mouth 3 (three) times daily as needed for cough. 11/03/21   Wallis BambergMani, Mario, PA-C  bicalutamide (CASODEX) 50 MG tablet TAKE 1 TABLET BY MOUTH EVERY DAY 05/30/20   Dessa PhiBadik, Jennifer, MD  cetirizine (ZYRTEC ALLERGY) 10 MG tablet Take 1 tablet (10 mg  total) by mouth daily. 11/03/21   Wallis BambergMani, Mario, PA-C  estradiol (ESTRACE) 2 MG tablet TAKE 1 TABLET BY MOUTH 3 TIMES DAILY. 09/23/21   Dessa PhiBadik, Jennifer, MD  medroxyPROGESTERone (DEPO-PROVERA) 150 MG/ML injection INJECT 1 ML (150 MG TOTAL) INTO THE MUSCLE EVERY 3 (THREE) MONTHS. 09/26/20 09/26/21  Dessa PhiBadik, Jennifer, MD  omeprazole (PRILOSEC) 20 MG capsule Take 1 capsule (20 mg total) by mouth daily. Patient not taking: Reported on 03/31/2021 10/22/20   Terrilee FilesButler, Michael C, MD  progesterone (PROMETRIUM) 100 MG capsule TAKE 1 CAPSULE BY MOUTH EVERY DAY 08/02/20   Dessa PhiBadik, Jennifer, MD  promethazine-dextromethorphan (PROMETHAZINE-DM) 6.25-15 MG/5ML syrup Take 5 mLs by mouth at bedtime as needed for cough. 11/03/21   Wallis BambergMani, Mario, PA-C  pseudoephedrine (SUDAFED) 60 MG tablet Take 1 tablet (60 mg total) by mouth every 8 (eight) hours as needed for congestion. 11/03/21   Wallis BambergMani, Mario, PA-C  spironolactone (ALDACTONE) 50 MG tablet TAKE 1 TABLET BY MOUTH EVERY DAY 11/27/19   Dessa PhiBadik, Jennifer, MD    Family History Family History  Problem Relation Age of Onset   Diabetes Mother    Hypertension Mother    Neuropathy Mother    Hyperlipidemia Mother    Healthy Sister    Heart disease Maternal Grandmother    Cancer Maternal Grandfather    Stroke Maternal Grandfather    Diabetes Paternal Grandmother    Stroke Paternal Grandmother    Aneurysm Paternal Grandfather    Cancer Paternal Grandfather    Drug abuse  Father     Social History Social History   Tobacco Use   Smoking status: Former   Smokeless tobacco: Never  Building services engineer Use: Every day  Substance Use Topics   Alcohol use: No   Drug use: Yes    Types: Marijuana     Allergies   Patient has no known allergies.   Review of Systems Review of Systems Per HPI  Physical Exam Triage Vital Signs ED Triage Vitals  Enc Vitals Group     BP 11/11/21 0923 119/78     Pulse Rate 11/11/21 0923 (!) 105     Resp 11/11/21 0923 18     Temp 11/11/21 0923  98.6 F (37 C)     Temp Source 11/11/21 0923 Oral     SpO2 11/11/21 0923 95 %     Weight --      Height --      Head Circumference --      Peak Flow --      Pain Score 11/11/21 0921 0     Pain Loc --      Pain Edu? --      Excl. in GC? --    No data found.  Updated Vital Signs BP 119/78 (BP Location: Right Arm)    Pulse (!) 105    Temp 98.6 F (37 C) (Oral)    Resp 18    SpO2 95%   Visual Acuity Right Eye Distance:   Left Eye Distance:   Bilateral Distance:    Right Eye Near:   Left Eye Near:    Bilateral Near:     Physical Exam Vitals and nursing note reviewed.  Constitutional:      Appearance: She is well-developed.  HENT:     Head: Atraumatic.     Right Ear: External ear normal.     Left Ear: External ear normal.     Nose: Rhinorrhea present.     Mouth/Throat:     Mouth: Mucous membranes are moist.     Pharynx: Posterior oropharyngeal erythema present. No oropharyngeal exudate.  Eyes:     Extraocular Movements: Extraocular movements intact.     Pupils: Pupils are equal, round, and reactive to light.     Comments: Bilateral conjunctival injection, left greater than right.  Dried crusting left lash lines  Cardiovascular:     Rate and Rhythm: Normal rate and regular rhythm.     Heart sounds: Normal heart sounds.  Pulmonary:     Effort: Pulmonary effort is normal. No respiratory distress.     Breath sounds: No wheezing or rales.  Musculoskeletal:        General: Normal range of motion.     Cervical back: Normal range of motion and neck supple.  Lymphadenopathy:     Cervical: No cervical adenopathy.  Skin:    General: Skin is warm and dry.  Neurological:     Mental Status: She is alert and oriented to person, place, and time.  Psychiatric:        Behavior: Behavior normal.   UC Treatments / Results  Labs (all labs ordered are listed, but only abnormal results are displayed) Labs Reviewed - No data to display  EKG  Radiology No results  found.  Procedures Procedures (including critical care time)  Medications Ordered in UC Medications - No data to display  Initial Impression / Assessment and Plan / UC Course  I have reviewed the triage vital signs and the nursing notes.  Pertinent labs & imaging results that were available during my care of the patient were reviewed by me and considered in my medical decision making (see chart for details).     Will cover for conjunctivitis with Polytrim drops, warm compresses, good hand hygiene.  Continue previous medications for lingering postviral cough, runny nose.  Return for any acutely worsening symptoms.  Final Clinical Impressions(s) / UC Diagnoses   Final diagnoses:  Acute bacterial conjunctivitis of left eye  Acute cough   Discharge Instructions   None    ED Prescriptions     Medication Sig Dispense Auth. Provider   trimethoprim-polymyxin b (POLYTRIM) ophthalmic solution Place 1 drop into both eyes every 6 (six) hours. 10 mL Particia Nearing, [place]      PDMP not reviewed this encounter.   Roosvelt Maser [place], [place] 11/11/21 [phone]

## 2022-02-06 ENCOUNTER — Ambulatory Visit
Admission: EM | Admit: 2022-02-06 | Discharge: 2022-02-06 | Disposition: A | Discharge status: Home / Self Care | Payer: Medicaid Other | Attending: Family Medicine | Admitting: Family Medicine

## 2022-02-06 DIAGNOSIS — Z113 Encounter for screening for infections with a predominantly sexual mode of transmission: Secondary | ICD-10-CM | POA: Insufficient documentation

## 2022-02-06 DIAGNOSIS — K629 Disease of anus and rectum, unspecified: Secondary | ICD-10-CM | POA: Insufficient documentation

## 2022-02-06 MED ORDER — VALACYCLOVIR HCL 1 G PO TABS: 1000.0000 mg | ORAL_TABLET | Freq: Two times a day (BID) | ORAL | 0 refills | Status: AC

## 2022-02-06 NOTE — Discharge Instructions (Signed)
May apply diaper ointment, Vaseline or Aquaphor to the area to help avoid irritation ?

## 2022-02-06 NOTE — ED Provider Notes (Signed)
?[facility] ? ? ? ?CSN: [id number] ?Arrival date & time: 02/06/22  1022 ? ? ?  ? ?History   ?Chief Complaint ?No chief complaint on file. ? ? ?HPI ?Billy Oliver is a 21 y.o. adult.  ? ?Presenting today with concern of some burning, stinging sensations in her anal region, clear mucus with wiping after trying have a bowel movement and mild constipation in the last 3 days.  She has 2 sexual partners but is unaware of any exposures to STDs.  Does want to be screened for STDs today.  Has not been trying anything other than a hemorrhoid suppository which did not help.  No known history of STIs.  Of note, patient is a transgender male.  ? ? ?Past Medical History:  ?Diagnosis Date  ? ADHD (attention deficit hyperactivity disorder)   ? Anxiety   ? Behavioral problems 06/21/2013  ? Scoliosis   ? ? ?Patient Active Problem List  ? Diagnosis Date Noted  ? Suicidal ideation 03/31/2021  ? Transgender 09/21/2018  ? 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 Hirsutism 02/08/2017  ? Emotional stress 09/18/2015  ? Behavioral problems 06/21/2013  ? ? ?History reviewed. No pertinent surgical history. ? ? ? ? ?Home Medications   ? ?Prior to Admission medications   ?Medication Sig Start Date End Date Taking? Authorizing Provider  ?valACYclovir (VALTREX) 1000 MG tablet Take 1 tablet (1,000 mg total) by mouth 2 (two) times daily for 7 days. 02/06/22 02/13/22 Yes Particia Nearing, PA-C  ?benzonatate (TESSALON) 100 MG capsule Take 1-2 capsules (100-200 mg total) by mouth 3 (three) times daily as needed for cough. 11/03/21   Wallis Bamberg, PA-C  ?bicalutamide (CASODEX) 50 MG tablet TAKE 1 TABLET BY MOUTH EVERY DAY 05/30/20   Dessa Phi, MD  ?cetirizine (ZYRTEC ALLERGY) 10 MG tablet Take 1 tablet (10 mg total) by mouth daily. 11/03/21   Wallis Bamberg, PA-C  ?estradiol (ESTRACE) 2 MG tablet TAKE 1 TABLET BY MOUTH 3 TIMES DAILY. 09/23/21   Dessa Phi, MD  ?medroxyPROGESTERone (DEPO-PROVERA) 150 MG/ML injection INJECT 1 ML (150 MG TOTAL)  INTO THE MUSCLE EVERY 3 (THREE) MONTHS. 09/26/20 09/26/21  Dessa Phi, MD  ?omeprazole (PRILOSEC) 20 MG capsule Take 1 capsule (20 mg total) by mouth daily. ?Patient not taking: Reported on 03/31/2021 10/22/20   Terrilee Files, MD  ?progesterone (PROMETRIUM) 100 MG capsule TAKE 1 CAPSULE BY MOUTH EVERY DAY 08/02/20   Dessa Phi, MD  ?promethazine-dextromethorphan (PROMETHAZINE-DM) 6.25-15 MG/5ML syrup Take 5 mLs by mouth at bedtime as needed for cough. 11/03/21   Wallis Bamberg, PA-C  ?pseudoephedrine (SUDAFED) 60 MG tablet Take 1 tablet (60 mg total) by mouth every 8 (eight) hours as needed for congestion. 11/03/21   Wallis Bamberg, PA-C  ?spironolactone (ALDACTONE) 50 MG tablet TAKE 1 TABLET BY MOUTH EVERY DAY 11/27/19   Dessa Phi, MD  ?trimethoprim-polymyxin b (POLYTRIM) ophthalmic solution Place 1 drop into both eyes every 6 (six) hours. 11/11/21   Particia Nearing, PA-C  ? ? ?Family History ?Family History  ?Problem Relation Age of Onset  ? Diabetes Mother   ? Hypertension Mother   ? Neuropathy Mother   ? Hyperlipidemia Mother   ? Healthy Sister   ? Heart disease Maternal Grandmother   ? Cancer Maternal Grandfather   ? Stroke Maternal Grandfather   ? Diabetes Paternal Grandmother   ? Stroke Paternal Grandmother   ? Aneurysm Paternal Grandfather   ? Cancer Paternal Grandfather   ? Drug abuse Father   ? ? ?  Social History ?Social History  ? ?Tobacco Use  ? Smoking status: Former  ?  Passive exposure: Never  ? Smokeless tobacco: Never  ?Vaping Use  ? Vaping Use: Every day  ? Substances: Nicotine, Flavoring  ?Substance Use Topics  ? Alcohol use: No  ? Drug use: Yes  ?  Types: Marijuana  ? ? ? ?Allergies   ?Patient has no known allergies. ? ? ?Review of Systems ?Review of Systems ?PER HPI ? ?Physical Exam ?Triage Vital Signs ?ED Triage Vitals [02/06/22 1056]  ?Enc Vitals Group  ?   BP 126/80  ?   Pulse Rate 85  ?   Resp 20  ?   Temp 98.7 ?F (37.1 ?C)  ?   Temp Source Oral  ?   SpO2 97 %  ?   Weight   ?    Height   ?   Head Circumference   ?   Peak Flow   ?   Pain Score 5  ?   Pain Loc   ?   Pain Edu?   ?   Excl. in GC?   ? ?No data found. ? ?Updated Vital Signs ?BP 126/80 (BP Location: Right Arm)   Pulse 85   Temp 98.7 ?F (37.1 ?C) (Oral)   Resp 20   SpO2 97%  ? ?Visual Acuity ?Right Eye Distance:   ?Left Eye Distance:   ?Bilateral Distance:   ? ?Right Eye Near:   ?Left Eye Near:    ?Bilateral Near:    ? ?Physical Exam ?Vitals and nursing note reviewed. Exam conducted with a chaperone present.  ?Constitutional:   ?   Appearance: Normal appearance.  ?HENT:  ?   Head: Atraumatic.  ?   Mouth/Throat:  ?   Mouth: Mucous membranes are moist.  ?Eyes:  ?   Extraocular Movements: Extraocular movements intact.  ?   Conjunctiva/sclera: Conjunctivae normal.  ?Cardiovascular:  ?   Rate and Rhythm: Normal rate and regular rhythm.  ?   Heart sounds: Normal heart sounds.  ?Genitourinary: ?   Comments: No obvious hemorrhoids, drainage, bleeding, fissures or abscesses in the anal region.  Small cluster of vesicular rash just beside the anal opening.  Tender to palpation in this area. ?Musculoskeletal:     ?   General: Normal range of motion.  ?   Cervical back: Normal range of motion and neck supple.  ?Skin: ?   General: Skin is warm and dry.  ?Neurological:  ?   Mental Status: She is alert.  ?   Motor: No weakness.  ?   Gait: Gait normal.  ?Psychiatric:     ?   Mood and Affect: Mood normal.     ?   Thought Content: Thought content normal.     ?   Judgment: Judgment normal.  ? ?UC Treatments / Results  ?Labs ?(all labs ordered are listed, but only abnormal results are displayed) ?Labs Reviewed  ?HSV CULTURE AND TYPING  ?HIV ANTIBODY (ROUTINE TESTING W REFLEX)  ?RPR  ?CYTOLOGY, (ORAL, ANAL, URETHRAL) ANCILLARY ONLY  ? ?EKG ? ?Radiology ?No results found. ? ?Procedures ?Procedures (including critical care time) ? ?Medications Ordered in UC ?Medications - No data to display ? ?Initial Impression / Assessment and Plan / UC Course  ?I  have reviewed the triage vital signs and the nursing notes. ? ?Pertinent labs & imaging results that were available during my care of the patient were reviewed by me and considered in my medical decision making (  see chart for details). ? ?  ? ?Highly suspicious for herpes rash.  HSV culture pending, treat with a course of Valtrex while awaiting results.  HIV, syphilis and anal swab also pending for rule out.  Discussed to apply Vaseline or Aquaphor to the area to help reduce irritation, avoid sexual contact until results return and follow-up if symptoms worsening. ? ?Final Clinical Impressions(s) / UC Diagnoses  ? ?Final diagnoses:  ?Screening for STD (sexually transmitted disease)  ?Anal lesion  ? ? ? ?Discharge Instructions   ? ?  ?May apply diaper ointment, Vaseline or Aquaphor to the area to help avoid irritation ? ? ? ?ED Prescriptions   ? ? Medication Sig Dispense Auth. Provider  ? valACYclovir (VALTREX) 1000 MG tablet Take 1 tablet (1,000 mg total) by mouth 2 (two) times daily for 7 days. 14 tablet Particia NearingLane, Rexanna Louthan [place], [place]  ? ?  ? ?PDMP not reviewed this encounter. ?  ?Particia NearingLane, Davone Shinault Elizabeth, PA-C ?02/06/22 1225 ? ?

## 2022-02-06 NOTE — ED Triage Notes (Addendum)
Pt states she has a ran a fever twice for the past few days and a sore throat ? ?Pt states she thinks they has an STD in her anal area ? ?Pt states it is irritated and it hurts when they wipe and mucus in stool ? ?Pt states she thought it was Hemorid's  and took a suppository  ?

## 2022-02-07 LAB — RPR: RPR Ser Ql: NONREACTIVE

## 2022-02-07 LAB — HIV ANTIBODY (ROUTINE TESTING W REFLEX): HIV Screen 4th Generation wRfx: NONREACTIVE

## 2022-02-10 LAB — CYTOLOGY, (ORAL, ANAL, URETHRAL) ANCILLARY ONLY
Chlamydia: NEGATIVE
Comment: NEGATIVE
Comment: NEGATIVE
Comment: NORMAL
Neisseria Gonorrhea: NEGATIVE
Trichomonas: NEGATIVE

## 2022-02-10 LAB — HSV CULTURE AND TYPING

## 2022-03-20 ENCOUNTER — Ambulatory Visit: Payer: Self-pay

## 2023-04-23 ENCOUNTER — Encounter (INDEPENDENT_AMBULATORY_CARE_PROVIDER_SITE_OTHER): Payer: Self-pay

## 2023-11-26 ENCOUNTER — Emergency Department (HOSPITAL_COMMUNITY): Payer: Self-pay

## 2023-11-26 ENCOUNTER — Other Ambulatory Visit: Payer: Self-pay

## 2023-11-26 ENCOUNTER — Emergency Department (HOSPITAL_COMMUNITY)
Admission: EM | Admit: 2023-11-26 | Discharge: 2023-11-26 | Disposition: A | Discharge status: Home / Self Care | Payer: Self-pay | Attending: Emergency Medicine | Admitting: Emergency Medicine

## 2023-11-26 DIAGNOSIS — Z1152 Encounter for screening for COVID-19: Secondary | ICD-10-CM | POA: Diagnosis not present

## 2023-11-26 DIAGNOSIS — E876 Hypokalemia: Secondary | ICD-10-CM | POA: Insufficient documentation

## 2023-11-26 DIAGNOSIS — R0602 Shortness of breath: Secondary | ICD-10-CM | POA: Diagnosis present

## 2023-11-26 DIAGNOSIS — J101 Influenza due to other identified influenza virus with other respiratory manifestations: Secondary | ICD-10-CM | POA: Insufficient documentation

## 2023-11-26 DIAGNOSIS — J111 Influenza due to unidentified influenza virus with other respiratory manifestations: Secondary | ICD-10-CM

## 2023-11-26 LAB — CBC WITH DIFFERENTIAL/PLATELET
Abs Immature Granulocytes: 0.01 10*3/uL (ref 0.00–0.07)
Basophils Absolute: 0 10*3/uL (ref 0.0–0.1)
Basophils Relative: 0 %
Eosinophils Absolute: 0 10*3/uL (ref 0.0–0.5)
Eosinophils Relative: 0 %
HCT: 44.3 % (ref 39.0–52.0)
Hemoglobin: 15.7 g/dL (ref 13.0–17.0)
Immature Granulocytes: 0 %
Lymphocytes Relative: 32 %
Lymphs Abs: 1.5 10*3/uL (ref 0.7–4.0)
MCH: 30.1 pg (ref 26.0–34.0)
MCHC: 35.4 g/dL (ref 30.0–36.0)
MCV: 84.9 fL (ref 80.0–100.0)
Monocytes Absolute: 0.3 10*3/uL (ref 0.1–1.0)
Monocytes Relative: 6 %
Neutro Abs: 2.9 10*3/uL (ref 1.7–7.7)
Neutrophils Relative %: 62 %
Platelets: 173 10*3/uL (ref 150–400)
RBC: 5.22 MIL/uL (ref 4.22–5.81)
RDW: 12 % (ref 11.5–15.5)
WBC: 4.7 10*3/uL (ref 4.0–10.5)
nRBC: 0 % (ref 0.0–0.2)

## 2023-11-26 LAB — RESP PANEL BY RT-PCR (RSV, FLU A&B, COVID)  RVPGX2
Influenza A by PCR: POSITIVE — AB
Influenza B by PCR: NEGATIVE
Resp Syncytial Virus by PCR: NEGATIVE
SARS Coronavirus 2 by RT PCR: NEGATIVE

## 2023-11-26 LAB — COMPREHENSIVE METABOLIC PANEL
ALT: 43 U/L (ref 0–44)
AST: 58 U/L — ABNORMAL HIGH (ref 15–41)
Albumin: 4.1 g/dL (ref 3.5–5.0)
Alkaline Phosphatase: 52 U/L (ref 38–126)
Anion gap: 13 (ref 5–15)
BUN: 12 mg/dL (ref 6–20)
CO2: 23 mmol/L (ref 22–32)
Calcium: 8.5 mg/dL — ABNORMAL LOW (ref 8.9–10.3)
Chloride: 95 mmol/L — ABNORMAL LOW (ref 98–111)
Creatinine, Ser: 1.23 mg/dL (ref 0.61–1.24)
GFR, Estimated: >60 mL/min (ref >60)
Glucose, Bld: 99 mg/dL (ref 70–99)
Potassium: 3.2 mmol/L — ABNORMAL LOW (ref 3.5–5.1)
Sodium: 131 mmol/L — ABNORMAL LOW (ref 135–145)
Total Bilirubin: 0.8 mg/dL (ref 0.0–1.2)
Total Protein: 7.5 g/dL (ref 6.5–8.1)

## 2023-11-26 MED ORDER — SODIUM CHLORIDE 0.9 % IV BOLUS
1000.0000 mL | Freq: Once | INTRAVENOUS | Status: AC
  Administered 2023-11-26: 1000 mL via INTRAVENOUS

## 2023-11-26 MED ORDER — ONDANSETRON HCL 4 MG/2ML IJ SOLN
4.0000 mg | Freq: Once | INTRAMUSCULAR | Status: AC
  Administered 2023-11-26: 4 mg via INTRAVENOUS
  Filled 2023-11-26: qty 2

## 2023-11-26 MED ORDER — ONDANSETRON HCL 4 MG PO TABS: 4.0000 mg | ORAL_TABLET | Freq: Four times a day (QID) | ORAL | 0 refills | Status: AC

## 2023-11-26 MED ORDER — POTASSIUM CHLORIDE CRYS ER 20 MEQ PO TBCR: 20.0000 meq | EXTENDED_RELEASE_TABLET | Freq: Two times a day (BID) | ORAL | 0 refills | Status: AC

## 2023-11-26 MED ORDER — BENZONATATE 200 MG PO CAPS: 200.0000 mg | ORAL_CAPSULE | Freq: Three times a day (TID) | ORAL | 0 refills | Status: AC | PRN

## 2023-11-26 NOTE — Discharge Instructions (Signed)
 Your flu test today was positive.  I recommend rest, plenty of fluids.  Bland diet as tolerated.  Your potassium was a little low today this is likely related to the vomiting and diarrhea.  You have been prescribed medication to increase your potassium.  You will need to have your potassium level rechecked next week.  Please follow-up with your primary care provider for recheck return to emergency department for any new or worsening symptoms

## 2023-11-26 NOTE — ED Triage Notes (Signed)
 Pt c/o shob, N/V/D x 4 days states he took tamiflu for the past couple of days without relief

## 2023-11-28 NOTE — ED Provider Notes (Signed)
 [place] EMERGENCY DEPARTMENT AT [facility] Provider Note   CSN: [id number] Arrival date & time: 11/26/23  9246     History  Chief Complaint  Patient presents with   Shortness of Breath    Billy Oliver is a 23 y.o. adult.   Shortness of Breath Associated symptoms: cough and vomiting   Associated symptoms: no abdominal pain, no chest pain and no fever         Billy Oliver is a 23 y.o. adult who presents to the Emergency Department complaining of generalized body aches, cough, congestion, N/V/D and malaise. Symptoms for 4 days.  Some shortness of breath with coughing.  Denies chest and abdominal pain.  No fever.   She is taking tamiflu without improvement   Home Medications Prior to Admission medications   Medication Sig Start Date End Date Taking? Authorizing Provider  benzonatate  (TESSALON ) 200 MG capsule Take 1 capsule (200 mg total) by mouth 3 (three) times daily as needed. Swallow whole, do not chew 11/26/23  Yes Poonam Woehrle, PA-C  ondansetron  (ZOFRAN ) 4 MG tablet Take 1 tablet (4 mg total) by mouth every 6 (six) hours. As needed for nausea vomiting 11/26/23  Yes Soraida Vickers, PA-C  potassium chloride  SA (KLOR-CON  M) 20 MEQ tablet Take 1 tablet (20 mEq total) by mouth 2 (two) times daily. 11/26/23  Yes Mubashir Mallek, PA-C  bicalutamide  (CASODEX ) 50 MG tablet TAKE 1 TABLET BY MOUTH EVERY DAY 05/30/20   Dorrene Nest, MD  cetirizine  (ZYRTEC  ALLERGY) 10 MG tablet Take 1 tablet (10 mg total) by mouth daily. 11/03/21   Christopher Savannah, PA-C  estradiol  (ESTRACE ) 2 MG tablet TAKE 1 TABLET BY MOUTH 3 TIMES DAILY. 09/23/21   Dorrene Nest, MD  medroxyPROGESTERone  (DEPO-PROVERA ) 150 MG/ML injection INJECT 1 ML (150 MG TOTAL) INTO THE MUSCLE EVERY 3 (THREE) MONTHS. 09/26/20 09/26/21  Dorrene Nest, MD  omeprazole  (PRILOSEC) 20 MG capsule Take 1 capsule (20 mg total) by mouth daily. Patient not taking: Reported on 03/31/2021 10/22/20   Towana Ozell BROCKS, MD  progesterone   (PROMETRIUM ) 100 MG capsule TAKE 1 CAPSULE BY MOUTH EVERY DAY 08/02/20   Dorrene Nest, MD  promethazine -dextromethorphan (PROMETHAZINE -DM) 6.25-15 MG/5ML syrup Take 5 mLs by mouth at bedtime as needed for cough. 11/03/21   Christopher Savannah, PA-C  pseudoephedrine  (SUDAFED) 60 MG tablet Take 1 tablet (60 mg total) by mouth every 8 (eight) hours as needed for congestion. 11/03/21   Christopher Savannah, PA-C  spironolactone  (ALDACTONE ) 50 MG tablet TAKE 1 TABLET BY MOUTH EVERY DAY 11/27/19   Dorrene Nest, MD  trimethoprim -polymyxin b  (POLYTRIM ) ophthalmic solution Place 1 drop into both eyes every 6 (six) hours. 11/11/21   Stuart Vernell Norris, PA-C      Allergies    Patient has no known allergies.    Review of Systems   Review of Systems  Constitutional:  Positive for appetite change and fatigue. Negative for fever.  HENT:  Positive for congestion and sneezing.   Respiratory:  Positive for cough and shortness of breath.   Cardiovascular:  Negative for chest pain.  Gastrointestinal:  Positive for diarrhea, nausea and vomiting. Negative for abdominal pain.  Genitourinary:  Negative for dysuria.  Musculoskeletal:  Positive for myalgias. Negative for arthralgias.  Neurological:  Negative for dizziness and weakness.    Physical Exam Updated Vital Signs BP (!) 113/56   Pulse 89   Temp 99.3 F (37.4 C) (Oral)   Resp 17   SpO2 95%  Physical Exam Vitals and nursing  note reviewed.  Constitutional:      General: She is not in acute distress.    Appearance: She is well-developed. She is not toxic-appearing.  HENT:     Mouth/Throat:     Mouth: Mucous membranes are moist.  Cardiovascular:     Rate and Rhythm: Normal rate and regular rhythm.     Pulses: Normal pulses.  Pulmonary:     Effort: Pulmonary effort is normal. No respiratory distress.     Breath sounds: No wheezing or rales.  Abdominal:     Palpations: Abdomen is soft.     Tenderness: There is no abdominal tenderness.  Musculoskeletal:      Cervical back: Normal range of motion.     Right lower leg: No edema.     Left lower leg: No edema.  Lymphadenopathy:     Cervical: No cervical adenopathy.  Skin:    Findings: No rash.  Neurological:     General: No focal deficit present.     Mental Status: She is alert.     Sensory: No sensory deficit.     Motor: No weakness.     ED Results / Procedures / Treatments   Labs (all labs ordered are listed, but only abnormal results are displayed) Labs Reviewed  RESP PANEL BY RT-PCR (RSV, FLU A&B, COVID)  RVPGX2 - Abnormal; Notable for the following components:      Result Value   Influenza A by PCR POSITIVE (*)    All other components within normal limits  COMPREHENSIVE METABOLIC PANEL - Abnormal; Notable for the following components:   Sodium 131 (*)    Potassium 3.2 (*)    Chloride 95 (*)    Calcium 8.5 (*)    AST 58 (*)    All other components within normal limits  CBC WITH DIFFERENTIAL/PLATELET    EKG None  Radiology No results found.  Procedures Procedures    Medications Ordered in ED Medications  sodium chloride  0.9 % bolus 1,000 mL (0 mLs Intravenous Stopped 11/26/23 1132)  ondansetron  (ZOFRAN ) injection 4 mg (4 mg Intravenous Given 11/26/23 9074)    ED Course/ Medical Decision Making/ A&P                                 Medical Decision Making Pt here with flu like sx's for several days.  Has been taking Tamiflu for 2 days w/o relief.  Shortn of breath associated with cough.  No fever   I suspect viral process.  PE, ACS, PNA, bronchitis also considered.    Amount and/or Complexity of Data Reviewed Labs: ordered.    Details: Flu A positive. Potassium slightly low Radiology: ordered.    Details: CXR w/o evidence of PNA.   Discussion of management or test interpretation with external provider(s):   Work up shows Flu A+.  Pt feeling better after IVF's and antiemetic.  Mildly hypokalemic. No vomiting here.  Pt is agreeable to close out patient up.   Appears appropriate for d/c home.   Risk Prescription drug management.           Final Clinical Impression(s) / ED Diagnoses Final diagnoses:  Influenza    Rx / DC Orders ED Discharge Orders          Ordered    potassium chloride  SA (KLOR-CON  M) 20 MEQ tablet  2 times daily        11/26/23 1124    ondansetron  (  ZOFRAN ) 4 MG tablet  Every 6 hours        11/26/23 1124    benzonatate  (TESSALON ) 200 MG capsule  3 times daily PRN        11/26/23 1124              Kamile Fassler, [place], PA-C 11/28/23 2312    Suzette Pac, MD 11/29/23 1106
